# Patient Record
Sex: Male | Born: 1950 | Race: White | Hispanic: No | Marital: Married | Smoking: Former smoker
Health system: Southern US, Community
[De-identification: ages and names within clinical notes are randomized; demographics above are authoritative.]

## PROBLEM LIST (undated history)

## (undated) DIAGNOSIS — J449 Chronic obstructive pulmonary disease, unspecified: Secondary | ICD-10-CM

## (undated) DIAGNOSIS — I2510 Atherosclerotic heart disease of native coronary artery without angina pectoris: Secondary | ICD-10-CM

## (undated) DIAGNOSIS — M542 Cervicalgia: Secondary | ICD-10-CM

## (undated) DIAGNOSIS — Z860100 Personal history of colon polyps, unspecified: Secondary | ICD-10-CM

## (undated) DIAGNOSIS — R600 Localized edema: Secondary | ICD-10-CM

## (undated) DIAGNOSIS — M549 Dorsalgia, unspecified: Secondary | ICD-10-CM

## (undated) DIAGNOSIS — N138 Other obstructive and reflux uropathy: Secondary | ICD-10-CM

## (undated) DIAGNOSIS — I119 Hypertensive heart disease without heart failure: Secondary | ICD-10-CM

## (undated) DIAGNOSIS — M4712 Other spondylosis with myelopathy, cervical region: Secondary | ICD-10-CM

## (undated) DIAGNOSIS — I25119 Atherosclerotic heart disease of native coronary artery with unspecified angina pectoris: Secondary | ICD-10-CM

## (undated) DIAGNOSIS — H26493 Other secondary cataract, bilateral: Secondary | ICD-10-CM

## (undated) DIAGNOSIS — E785 Hyperlipidemia, unspecified: Secondary | ICD-10-CM

## (undated) DIAGNOSIS — E119 Type 2 diabetes mellitus without complications: Secondary | ICD-10-CM

## (undated) DIAGNOSIS — J189 Pneumonia, unspecified organism: Secondary | ICD-10-CM

## (undated) DIAGNOSIS — K219 Gastro-esophageal reflux disease without esophagitis: Secondary | ICD-10-CM

## (undated) DIAGNOSIS — M1990 Unspecified osteoarthritis, unspecified site: Secondary | ICD-10-CM

## (undated) DIAGNOSIS — F17200 Nicotine dependence, unspecified, uncomplicated: Secondary | ICD-10-CM

## (undated) DIAGNOSIS — I739 Peripheral vascular disease, unspecified: Secondary | ICD-10-CM

## (undated) DIAGNOSIS — M545 Low back pain, unspecified: Secondary | ICD-10-CM

## (undated) DIAGNOSIS — E1169 Type 2 diabetes mellitus with other specified complication: Secondary | ICD-10-CM

## (undated) DIAGNOSIS — H01009 Unspecified blepharitis unspecified eye, unspecified eyelid: Secondary | ICD-10-CM

## (undated) DIAGNOSIS — G8929 Other chronic pain: Secondary | ICD-10-CM

## (undated) DIAGNOSIS — I10 Essential (primary) hypertension: Secondary | ICD-10-CM

## (undated) HISTORY — DX: Other obstructive and reflux uropathy: N13.8

## (undated) HISTORY — DX: Other spondylosis with myelopathy, cervical region: M47.12

## (undated) HISTORY — PX: BACK SURGERY: SHX140

## (undated) HISTORY — DX: Cervicalgia: M54.2

## (undated) HISTORY — DX: Hyperlipidemia, unspecified: E78.5

## (undated) HISTORY — DX: Low back pain, unspecified: M54.50

## (undated) HISTORY — DX: Atherosclerotic heart disease of native coronary artery with unspecified angina pectoris: I25.119

## (undated) HISTORY — DX: Other secondary cataract, bilateral: H26.493

## (undated) HISTORY — PX: CERVICAL SPINE SURGERY: SHX589

## (undated) HISTORY — PX: SPINE SURGERY: SHX786

## (undated) HISTORY — DX: Personal history of colon polyps, unspecified: Z86.0100

## (undated) HISTORY — PX: CORONARY ARTERY BYPASS GRAFT: SHX141

## (undated) HISTORY — DX: Personal history of colonic polyps: Z86.010

## (undated) HISTORY — DX: Nicotine dependence, unspecified, uncomplicated: F17.200

## (undated) HISTORY — PX: EYE SURGERY: SHX253

## (undated) HISTORY — DX: Gastro-esophageal reflux disease without esophagitis: K21.9

## (undated) HISTORY — DX: Unspecified osteoarthritis, unspecified site: M19.90

## (undated) HISTORY — DX: Unspecified blepharitis unspecified eye, unspecified eyelid: H01.009

## (undated) HISTORY — DX: Chronic obstructive pulmonary disease, unspecified: J44.9

## (undated) HISTORY — DX: Pneumonia, unspecified organism: J18.9

## (undated) HISTORY — DX: Peripheral vascular disease, unspecified: I73.9

## (undated) HISTORY — DX: Localized edema: R60.0

## (undated) HISTORY — DX: Type 2 diabetes mellitus with other specified complication: E11.69

## (undated) HISTORY — DX: Hypertensive heart disease without heart failure: I11.9

---
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
## 2012-11-06 DIAGNOSIS — E109 Type 1 diabetes mellitus without complications: Secondary | ICD-10-CM | POA: Diagnosis not present

## 2012-11-06 DIAGNOSIS — M545 Low back pain, unspecified: Secondary | ICD-10-CM | POA: Diagnosis not present

## 2012-11-06 DIAGNOSIS — E785 Hyperlipidemia, unspecified: Secondary | ICD-10-CM | POA: Diagnosis not present

## 2012-11-06 DIAGNOSIS — I10 Essential (primary) hypertension: Secondary | ICD-10-CM | POA: Diagnosis not present

## 2012-11-11 DIAGNOSIS — I2510 Atherosclerotic heart disease of native coronary artery without angina pectoris: Secondary | ICD-10-CM | POA: Diagnosis not present

## 2012-11-11 DIAGNOSIS — F17200 Nicotine dependence, unspecified, uncomplicated: Secondary | ICD-10-CM | POA: Diagnosis not present

## 2012-11-11 DIAGNOSIS — I119 Hypertensive heart disease without heart failure: Secondary | ICD-10-CM | POA: Diagnosis not present

## 2012-11-11 DIAGNOSIS — E119 Type 2 diabetes mellitus without complications: Secondary | ICD-10-CM | POA: Diagnosis not present

## 2012-12-03 DIAGNOSIS — I119 Hypertensive heart disease without heart failure: Secondary | ICD-10-CM | POA: Diagnosis not present

## 2013-01-01 DIAGNOSIS — I119 Hypertensive heart disease without heart failure: Secondary | ICD-10-CM | POA: Diagnosis not present

## 2013-01-01 DIAGNOSIS — I509 Heart failure, unspecified: Secondary | ICD-10-CM | POA: Diagnosis not present

## 2013-01-05 DIAGNOSIS — IMO0002 Reserved for concepts with insufficient information to code with codable children: Secondary | ICD-10-CM | POA: Diagnosis not present

## 2013-01-08 DIAGNOSIS — M5137 Other intervertebral disc degeneration, lumbosacral region: Secondary | ICD-10-CM | POA: Diagnosis not present

## 2013-01-08 DIAGNOSIS — M5126 Other intervertebral disc displacement, lumbar region: Secondary | ICD-10-CM | POA: Diagnosis not present

## 2013-02-04 DIAGNOSIS — I119 Hypertensive heart disease without heart failure: Secondary | ICD-10-CM | POA: Diagnosis not present

## 2013-02-05 DIAGNOSIS — K2100 Gastro-esophageal reflux disease with esophagitis, without bleeding: Secondary | ICD-10-CM | POA: Diagnosis not present

## 2013-02-05 DIAGNOSIS — J449 Chronic obstructive pulmonary disease, unspecified: Secondary | ICD-10-CM | POA: Diagnosis not present

## 2013-02-05 DIAGNOSIS — M545 Low back pain, unspecified: Secondary | ICD-10-CM | POA: Diagnosis not present

## 2013-02-05 DIAGNOSIS — I259 Chronic ischemic heart disease, unspecified: Secondary | ICD-10-CM | POA: Diagnosis not present

## 2013-02-05 DIAGNOSIS — E109 Type 1 diabetes mellitus without complications: Secondary | ICD-10-CM | POA: Diagnosis not present

## 2013-05-07 DIAGNOSIS — J449 Chronic obstructive pulmonary disease, unspecified: Secondary | ICD-10-CM | POA: Diagnosis not present

## 2013-05-07 DIAGNOSIS — I10 Essential (primary) hypertension: Secondary | ICD-10-CM | POA: Diagnosis not present

## 2013-05-07 DIAGNOSIS — M545 Low back pain, unspecified: Secondary | ICD-10-CM | POA: Diagnosis not present

## 2013-05-07 DIAGNOSIS — E785 Hyperlipidemia, unspecified: Secondary | ICD-10-CM | POA: Diagnosis not present

## 2013-05-07 DIAGNOSIS — E109 Type 1 diabetes mellitus without complications: Secondary | ICD-10-CM | POA: Diagnosis not present

## 2013-05-12 DIAGNOSIS — I2510 Atherosclerotic heart disease of native coronary artery without angina pectoris: Secondary | ICD-10-CM | POA: Diagnosis not present

## 2013-05-12 DIAGNOSIS — I119 Hypertensive heart disease without heart failure: Secondary | ICD-10-CM | POA: Diagnosis not present

## 2013-08-09 DIAGNOSIS — J449 Chronic obstructive pulmonary disease, unspecified: Secondary | ICD-10-CM | POA: Diagnosis not present

## 2013-08-09 DIAGNOSIS — Z23 Encounter for immunization: Secondary | ICD-10-CM | POA: Diagnosis not present

## 2013-08-09 DIAGNOSIS — M545 Low back pain, unspecified: Secondary | ICD-10-CM | POA: Diagnosis not present

## 2013-08-09 DIAGNOSIS — I259 Chronic ischemic heart disease, unspecified: Secondary | ICD-10-CM | POA: Diagnosis not present

## 2013-08-09 DIAGNOSIS — I119 Hypertensive heart disease without heart failure: Secondary | ICD-10-CM | POA: Diagnosis not present

## 2013-10-20 DIAGNOSIS — I10 Essential (primary) hypertension: Secondary | ICD-10-CM | POA: Diagnosis not present

## 2013-10-20 DIAGNOSIS — I119 Hypertensive heart disease without heart failure: Secondary | ICD-10-CM | POA: Diagnosis not present

## 2013-10-20 DIAGNOSIS — IMO0001 Reserved for inherently not codable concepts without codable children: Secondary | ICD-10-CM | POA: Diagnosis not present

## 2013-10-20 DIAGNOSIS — M545 Low back pain, unspecified: Secondary | ICD-10-CM | POA: Diagnosis not present

## 2013-10-20 DIAGNOSIS — I259 Chronic ischemic heart disease, unspecified: Secondary | ICD-10-CM | POA: Diagnosis not present

## 2013-10-20 DIAGNOSIS — J4489 Other specified chronic obstructive pulmonary disease: Secondary | ICD-10-CM | POA: Diagnosis not present

## 2013-10-20 DIAGNOSIS — J449 Chronic obstructive pulmonary disease, unspecified: Secondary | ICD-10-CM | POA: Diagnosis not present

## 2013-11-09 DIAGNOSIS — M545 Low back pain, unspecified: Secondary | ICD-10-CM | POA: Diagnosis not present

## 2013-11-09 DIAGNOSIS — E109 Type 1 diabetes mellitus without complications: Secondary | ICD-10-CM | POA: Diagnosis not present

## 2013-11-09 DIAGNOSIS — I259 Chronic ischemic heart disease, unspecified: Secondary | ICD-10-CM | POA: Diagnosis not present

## 2013-11-09 DIAGNOSIS — I119 Hypertensive heart disease without heart failure: Secondary | ICD-10-CM | POA: Diagnosis not present

## 2014-01-19 ENCOUNTER — Encounter: Payer: Self-pay | Admitting: Family Medicine

## 2014-01-19 DIAGNOSIS — Z79899 Other long term (current) drug therapy: Secondary | ICD-10-CM | POA: Diagnosis not present

## 2014-01-19 DIAGNOSIS — J449 Chronic obstructive pulmonary disease, unspecified: Secondary | ICD-10-CM | POA: Diagnosis not present

## 2014-01-19 DIAGNOSIS — E109 Type 1 diabetes mellitus without complications: Secondary | ICD-10-CM | POA: Diagnosis not present

## 2014-01-19 DIAGNOSIS — Z5181 Encounter for therapeutic drug level monitoring: Secondary | ICD-10-CM | POA: Diagnosis not present

## 2014-01-19 DIAGNOSIS — J4489 Other specified chronic obstructive pulmonary disease: Secondary | ICD-10-CM | POA: Diagnosis not present

## 2014-01-19 DIAGNOSIS — M545 Low back pain, unspecified: Secondary | ICD-10-CM | POA: Diagnosis not present

## 2014-01-19 DIAGNOSIS — I259 Chronic ischemic heart disease, unspecified: Secondary | ICD-10-CM | POA: Diagnosis not present

## 2014-01-19 DIAGNOSIS — K2100 Gastro-esophageal reflux disease with esophagitis, without bleeding: Secondary | ICD-10-CM | POA: Diagnosis not present

## 2014-01-19 DIAGNOSIS — I119 Hypertensive heart disease without heart failure: Secondary | ICD-10-CM | POA: Diagnosis not present

## 2014-02-08 DIAGNOSIS — M545 Low back pain, unspecified: Secondary | ICD-10-CM | POA: Diagnosis not present

## 2014-02-08 DIAGNOSIS — E785 Hyperlipidemia, unspecified: Secondary | ICD-10-CM | POA: Diagnosis not present

## 2014-02-08 DIAGNOSIS — I259 Chronic ischemic heart disease, unspecified: Secondary | ICD-10-CM | POA: Diagnosis not present

## 2014-02-08 DIAGNOSIS — J449 Chronic obstructive pulmonary disease, unspecified: Secondary | ICD-10-CM | POA: Diagnosis not present

## 2014-02-24 DIAGNOSIS — R7989 Other specified abnormal findings of blood chemistry: Secondary | ICD-10-CM | POA: Diagnosis not present

## 2014-02-24 DIAGNOSIS — E785 Hyperlipidemia, unspecified: Secondary | ICD-10-CM | POA: Diagnosis not present

## 2014-02-24 DIAGNOSIS — I2510 Atherosclerotic heart disease of native coronary artery without angina pectoris: Secondary | ICD-10-CM | POA: Diagnosis not present

## 2014-02-24 DIAGNOSIS — I119 Hypertensive heart disease without heart failure: Secondary | ICD-10-CM | POA: Diagnosis not present

## 2014-03-25 DIAGNOSIS — I119 Hypertensive heart disease without heart failure: Secondary | ICD-10-CM | POA: Diagnosis not present

## 2014-03-30 DIAGNOSIS — J309 Allergic rhinitis, unspecified: Secondary | ICD-10-CM | POA: Diagnosis not present

## 2014-03-30 DIAGNOSIS — H6500 Acute serous otitis media, unspecified ear: Secondary | ICD-10-CM | POA: Diagnosis not present

## 2014-03-30 DIAGNOSIS — J3489 Other specified disorders of nose and nasal sinuses: Secondary | ICD-10-CM | POA: Diagnosis not present

## 2014-03-30 DIAGNOSIS — R0982 Postnasal drip: Secondary | ICD-10-CM | POA: Diagnosis not present

## 2014-04-14 DIAGNOSIS — E669 Obesity, unspecified: Secondary | ICD-10-CM | POA: Diagnosis not present

## 2014-04-14 DIAGNOSIS — T7840XA Allergy, unspecified, initial encounter: Secondary | ICD-10-CM | POA: Diagnosis not present

## 2014-04-14 DIAGNOSIS — I119 Hypertensive heart disease without heart failure: Secondary | ICD-10-CM | POA: Diagnosis not present

## 2014-04-21 DIAGNOSIS — I10 Essential (primary) hypertension: Secondary | ICD-10-CM | POA: Diagnosis not present

## 2014-04-21 DIAGNOSIS — M545 Low back pain, unspecified: Secondary | ICD-10-CM | POA: Diagnosis not present

## 2014-04-21 DIAGNOSIS — E109 Type 1 diabetes mellitus without complications: Secondary | ICD-10-CM | POA: Diagnosis not present

## 2014-04-21 DIAGNOSIS — E785 Hyperlipidemia, unspecified: Secondary | ICD-10-CM | POA: Diagnosis not present

## 2014-04-21 DIAGNOSIS — J449 Chronic obstructive pulmonary disease, unspecified: Secondary | ICD-10-CM | POA: Diagnosis not present

## 2014-04-21 DIAGNOSIS — I259 Chronic ischemic heart disease, unspecified: Secondary | ICD-10-CM | POA: Diagnosis not present

## 2014-05-09 DIAGNOSIS — J310 Chronic rhinitis: Secondary | ICD-10-CM | POA: Diagnosis not present

## 2014-05-09 DIAGNOSIS — H905 Unspecified sensorineural hearing loss: Secondary | ICD-10-CM | POA: Diagnosis not present

## 2014-05-09 DIAGNOSIS — J3489 Other specified disorders of nose and nasal sinuses: Secondary | ICD-10-CM | POA: Diagnosis not present

## 2014-05-09 DIAGNOSIS — H903 Sensorineural hearing loss, bilateral: Secondary | ICD-10-CM | POA: Diagnosis not present

## 2014-05-11 DIAGNOSIS — M545 Low back pain, unspecified: Secondary | ICD-10-CM | POA: Diagnosis not present

## 2014-05-11 DIAGNOSIS — K2100 Gastro-esophageal reflux disease with esophagitis, without bleeding: Secondary | ICD-10-CM | POA: Diagnosis not present

## 2014-05-11 DIAGNOSIS — E109 Type 1 diabetes mellitus without complications: Secondary | ICD-10-CM | POA: Diagnosis not present

## 2014-05-11 DIAGNOSIS — J449 Chronic obstructive pulmonary disease, unspecified: Secondary | ICD-10-CM | POA: Diagnosis not present

## 2014-05-12 DIAGNOSIS — I119 Hypertensive heart disease without heart failure: Secondary | ICD-10-CM | POA: Diagnosis not present

## 2014-07-19 ENCOUNTER — Encounter: Payer: Self-pay | Admitting: Family Medicine

## 2014-07-19 DIAGNOSIS — M5116 Intervertebral disc disorders with radiculopathy, lumbar region: Secondary | ICD-10-CM | POA: Diagnosis not present

## 2014-07-19 DIAGNOSIS — M2578 Osteophyte, vertebrae: Secondary | ICD-10-CM | POA: Diagnosis not present

## 2014-07-19 DIAGNOSIS — M4726 Other spondylosis with radiculopathy, lumbar region: Secondary | ICD-10-CM | POA: Diagnosis not present

## 2014-07-19 DIAGNOSIS — M4727 Other spondylosis with radiculopathy, lumbosacral region: Secondary | ICD-10-CM | POA: Diagnosis not present

## 2014-07-19 DIAGNOSIS — M545 Low back pain: Secondary | ICD-10-CM | POA: Diagnosis not present

## 2014-07-19 DIAGNOSIS — M5117 Intervertebral disc disorders with radiculopathy, lumbosacral region: Secondary | ICD-10-CM | POA: Diagnosis not present

## 2014-07-26 DIAGNOSIS — M4806 Spinal stenosis, lumbar region: Secondary | ICD-10-CM | POA: Diagnosis not present

## 2014-07-29 DIAGNOSIS — M5136 Other intervertebral disc degeneration, lumbar region: Secondary | ICD-10-CM | POA: Diagnosis not present

## 2014-07-29 DIAGNOSIS — M5126 Other intervertebral disc displacement, lumbar region: Secondary | ICD-10-CM | POA: Diagnosis not present

## 2014-07-29 DIAGNOSIS — M545 Low back pain: Secondary | ICD-10-CM | POA: Diagnosis not present

## 2014-07-29 DIAGNOSIS — I739 Peripheral vascular disease, unspecified: Secondary | ICD-10-CM | POA: Diagnosis not present

## 2014-07-29 DIAGNOSIS — M4806 Spinal stenosis, lumbar region: Secondary | ICD-10-CM | POA: Diagnosis not present

## 2014-08-15 DIAGNOSIS — E119 Type 2 diabetes mellitus without complications: Secondary | ICD-10-CM | POA: Diagnosis not present

## 2014-08-15 DIAGNOSIS — J449 Chronic obstructive pulmonary disease, unspecified: Secondary | ICD-10-CM | POA: Diagnosis not present

## 2014-08-15 DIAGNOSIS — M545 Low back pain: Secondary | ICD-10-CM | POA: Diagnosis not present

## 2014-08-15 DIAGNOSIS — I2510 Atherosclerotic heart disease of native coronary artery without angina pectoris: Secondary | ICD-10-CM | POA: Diagnosis not present

## 2014-08-26 DIAGNOSIS — E785 Hyperlipidemia, unspecified: Secondary | ICD-10-CM | POA: Diagnosis not present

## 2014-08-26 DIAGNOSIS — I10 Essential (primary) hypertension: Secondary | ICD-10-CM | POA: Diagnosis not present

## 2014-08-26 DIAGNOSIS — E669 Obesity, unspecified: Secondary | ICD-10-CM | POA: Diagnosis not present

## 2014-08-26 DIAGNOSIS — I2510 Atherosclerotic heart disease of native coronary artery without angina pectoris: Secondary | ICD-10-CM | POA: Diagnosis not present

## 2014-08-29 DIAGNOSIS — M79605 Pain in left leg: Secondary | ICD-10-CM | POA: Diagnosis not present

## 2014-08-29 DIAGNOSIS — R0989 Other specified symptoms and signs involving the circulatory and respiratory systems: Secondary | ICD-10-CM | POA: Diagnosis not present

## 2014-08-29 DIAGNOSIS — M5136 Other intervertebral disc degeneration, lumbar region: Secondary | ICD-10-CM | POA: Diagnosis not present

## 2014-08-29 DIAGNOSIS — M4316 Spondylolisthesis, lumbar region: Secondary | ICD-10-CM | POA: Diagnosis not present

## 2014-08-29 DIAGNOSIS — Z01818 Encounter for other preprocedural examination: Secondary | ICD-10-CM | POA: Diagnosis not present

## 2014-08-29 DIAGNOSIS — Z79899 Other long term (current) drug therapy: Secondary | ICD-10-CM | POA: Diagnosis not present

## 2014-08-29 DIAGNOSIS — M4806 Spinal stenosis, lumbar region: Secondary | ICD-10-CM | POA: Diagnosis not present

## 2014-08-29 DIAGNOSIS — M79604 Pain in right leg: Secondary | ICD-10-CM | POA: Diagnosis not present

## 2014-08-31 DIAGNOSIS — I2510 Atherosclerotic heart disease of native coronary artery without angina pectoris: Secondary | ICD-10-CM | POA: Diagnosis not present

## 2014-08-31 DIAGNOSIS — R0602 Shortness of breath: Secondary | ICD-10-CM | POA: Diagnosis not present

## 2014-08-31 DIAGNOSIS — R079 Chest pain, unspecified: Secondary | ICD-10-CM | POA: Diagnosis not present

## 2014-08-31 DIAGNOSIS — R0789 Other chest pain: Secondary | ICD-10-CM | POA: Diagnosis not present

## 2014-09-01 DIAGNOSIS — I6523 Occlusion and stenosis of bilateral carotid arteries: Secondary | ICD-10-CM | POA: Diagnosis not present

## 2014-09-01 DIAGNOSIS — R0989 Other specified symptoms and signs involving the circulatory and respiratory systems: Secondary | ICD-10-CM | POA: Diagnosis not present

## 2014-09-02 DIAGNOSIS — K219 Gastro-esophageal reflux disease without esophagitis: Secondary | ICD-10-CM | POA: Diagnosis present

## 2014-09-02 DIAGNOSIS — Z0189 Encounter for other specified special examinations: Secondary | ICD-10-CM | POA: Diagnosis not present

## 2014-09-02 DIAGNOSIS — I10 Essential (primary) hypertension: Secondary | ICD-10-CM | POA: Diagnosis present

## 2014-09-02 DIAGNOSIS — Z951 Presence of aortocoronary bypass graft: Secondary | ICD-10-CM | POA: Diagnosis not present

## 2014-09-02 DIAGNOSIS — M4806 Spinal stenosis, lumbar region: Secondary | ICD-10-CM | POA: Diagnosis not present

## 2014-09-02 DIAGNOSIS — I25810 Atherosclerosis of coronary artery bypass graft(s) without angina pectoris: Secondary | ICD-10-CM | POA: Diagnosis present

## 2014-09-02 DIAGNOSIS — Z23 Encounter for immunization: Secondary | ICD-10-CM | POA: Diagnosis not present

## 2014-10-20 DIAGNOSIS — I2510 Atherosclerotic heart disease of native coronary artery without angina pectoris: Secondary | ICD-10-CM | POA: Diagnosis not present

## 2014-10-20 DIAGNOSIS — E119 Type 2 diabetes mellitus without complications: Secondary | ICD-10-CM | POA: Diagnosis not present

## 2014-10-20 DIAGNOSIS — I119 Hypertensive heart disease without heart failure: Secondary | ICD-10-CM | POA: Diagnosis not present

## 2014-10-20 DIAGNOSIS — E785 Hyperlipidemia, unspecified: Secondary | ICD-10-CM | POA: Diagnosis not present

## 2014-10-20 DIAGNOSIS — J449 Chronic obstructive pulmonary disease, unspecified: Secondary | ICD-10-CM | POA: Diagnosis not present

## 2014-11-07 DIAGNOSIS — I10 Essential (primary) hypertension: Secondary | ICD-10-CM | POA: Diagnosis not present

## 2014-11-07 DIAGNOSIS — M545 Low back pain: Secondary | ICD-10-CM | POA: Diagnosis not present

## 2014-11-07 DIAGNOSIS — J449 Chronic obstructive pulmonary disease, unspecified: Secondary | ICD-10-CM | POA: Diagnosis not present

## 2014-11-07 DIAGNOSIS — M4712 Other spondylosis with myelopathy, cervical region: Secondary | ICD-10-CM | POA: Diagnosis not present

## 2015-02-08 DIAGNOSIS — J449 Chronic obstructive pulmonary disease, unspecified: Secondary | ICD-10-CM | POA: Diagnosis not present

## 2015-02-08 DIAGNOSIS — N138 Other obstructive and reflux uropathy: Secondary | ICD-10-CM | POA: Diagnosis not present

## 2015-02-08 DIAGNOSIS — E119 Type 2 diabetes mellitus without complications: Secondary | ICD-10-CM | POA: Diagnosis not present

## 2015-02-08 DIAGNOSIS — M545 Low back pain: Secondary | ICD-10-CM | POA: Diagnosis not present

## 2015-02-20 ENCOUNTER — Telehealth: Payer: Self-pay

## 2015-02-20 NOTE — Telephone Encounter (Signed)
Gastroenterology Pre-Procedure Review  Request Date: 02/20/2015 Requesting Physician: Dr. Luan Pulling  PATIENT REVIEW QUESTIONS: The patient responded to the following health history questions as indicated:    1. Diabetes Melitis: YES 2. Joint replacements in the past 12 months: no 3. Major health problems in the past 3 months: no 4. Has an artificial valve or MVP: no 5. Has a defibrillator: no 6. Has been advised in past to take antibiotics in advance of a procedure like teeth cleaning: no    MEDICATIONS & ALLERGIES:    Patient reports the following regarding taking any blood thinners:   Plavix? no Aspirin? YES Coumadin? no  Patient confirms/reports the following medications:  Current Outpatient Prescriptions  Medication Sig Dispense Refill  . amLODipine (NORVASC) 10 MG tablet Take 10 mg by mouth daily.    Marland Kitchen aspirin 81 MG tablet Take 81 mg by mouth daily.    . cyclobenzaprine (FLEXERIL) 10 MG tablet Take 10 mg by mouth 2 (two) times daily.    . enalapril (VASOTEC) 20 MG tablet Take 20 mg by mouth daily.    . hydrochlorothiazide (HYDRODIURIL) 12.5 MG tablet Take 12.5 mg by mouth daily.    Marland Kitchen HYDROcodone-acetaminophen (NORCO) 10-325 MG per tablet Take 1 tablet by mouth every 6 (six) hours as needed. Pt takes one qid    . metFORMIN (GLUCOPHAGE) 500 MG tablet Take by mouth 2 (two) times daily with a meal.    . metoprolol tartrate (LOPRESSOR) 25 MG tablet Take 25 mg by mouth 2 (two) times daily.    . pravastatin (PRAVACHOL) 80 MG tablet Take 80 mg by mouth daily.    . tamsulosin (FLOMAX) 0.4 MG CAPS capsule Take 0.4 mg by mouth.     No current facility-administered medications for this visit.    Patient confirms/reports the following allergies:  No Known Allergies  No orders of the defined types were placed in this encounter.    AUTHORIZATION INFORMATION Primary Insurance:  ID #:  Group #:  Pre-Cert / Auth required:  Pre-Cert / Auth #:   Secondary Insurance:  ID #:  Group #:   Pre-Cert / Auth required:  Pre-Cert / Auth #:   SCHEDULE INFORMATION: Procedure has been scheduled as follows:  Date:  03/03/2015               Time:  12:30 PM Location: [facility] Short Stay  This Gastroenterology Pre-Precedure Review Form is being routed to the following provider(s): Barney Drain, MD

## 2015-02-21 ENCOUNTER — Other Ambulatory Visit: Payer: Self-pay

## 2015-02-21 DIAGNOSIS — Z1211 Encounter for screening for malignant neoplasm of colon: Secondary | ICD-10-CM

## 2015-02-21 MED ORDER — SOD PICOSULFATE-MAG OX-CIT ACD 10-3.5-12 MG-GM-GM PO PACK: 1.0000 | PACK | ORAL | Status: DC

## 2015-02-21 NOTE — Telephone Encounter (Signed)
Rx sent to the pharmacy and instructions mailed to pt.  

## 2015-02-21 NOTE — Telephone Encounter (Signed)
PREPOPIK-DRINK WATER TO KEEP URINE LIGHT YELLOW.  HOLD HCTZ on the DAY BEFORE AND DAY OF PROCEDURE.

## 2015-02-22 NOTE — Telephone Encounter (Signed)
Prepopik not covered. Rx for Trilyte sent to the pharmacy with faxed instructions and Rosendo Gros had told the wife to ask for the instructions at the pharmacy.

## 2015-02-23 ENCOUNTER — Telehealth: Payer: Self-pay

## 2015-02-23 MED ORDER — PEG 3350-KCL-NA BICARB-NACL 420 G PO SOLR: 4000.0000 mL | ORAL | Status: DC

## 2015-02-23 NOTE — Telephone Encounter (Signed)
I called the pharmacy and confirmed they received the Rx and the faxed instructions.

## 2015-02-23 NOTE — Telephone Encounter (Signed)
Pt called and pharmacy had not received new Rx for the Trilyte prep. Send it in and refaxed the instructions.

## 2015-03-03 ENCOUNTER — Ambulatory Visit (HOSPITAL_COMMUNITY)
Admission: RE | Admit: 2015-03-03 | Discharge: 2015-03-03 | Disposition: A | Discharge status: Home / Self Care | Payer: Medicare Other | Source: Ambulatory Visit | Attending: Gastroenterology | Admitting: Gastroenterology

## 2015-03-03 ENCOUNTER — Encounter (HOSPITAL_COMMUNITY): Payer: Self-pay | Admitting: *Deleted

## 2015-03-03 ENCOUNTER — Encounter (HOSPITAL_COMMUNITY)
Admission: RE | Disposition: A | Discharge status: Home / Self Care | Payer: Self-pay | Source: Ambulatory Visit | Attending: Gastroenterology

## 2015-03-03 DIAGNOSIS — K621 Rectal polyp: Secondary | ICD-10-CM | POA: Diagnosis not present

## 2015-03-03 DIAGNOSIS — K648 Other hemorrhoids: Secondary | ICD-10-CM | POA: Diagnosis not present

## 2015-03-03 DIAGNOSIS — Z79899 Other long term (current) drug therapy: Secondary | ICD-10-CM | POA: Diagnosis not present

## 2015-03-03 DIAGNOSIS — G8929 Other chronic pain: Secondary | ICD-10-CM | POA: Insufficient documentation

## 2015-03-03 DIAGNOSIS — D123 Benign neoplasm of transverse colon: Secondary | ICD-10-CM | POA: Diagnosis not present

## 2015-03-03 DIAGNOSIS — Z1211 Encounter for screening for malignant neoplasm of colon: Secondary | ICD-10-CM | POA: Diagnosis not present

## 2015-03-03 DIAGNOSIS — Z951 Presence of aortocoronary bypass graft: Secondary | ICD-10-CM | POA: Insufficient documentation

## 2015-03-03 DIAGNOSIS — K5730 Diverticulosis of large intestine without perforation or abscess without bleeding: Secondary | ICD-10-CM | POA: Diagnosis not present

## 2015-03-03 DIAGNOSIS — K635 Polyp of colon: Secondary | ICD-10-CM

## 2015-03-03 DIAGNOSIS — E119 Type 2 diabetes mellitus without complications: Secondary | ICD-10-CM | POA: Diagnosis not present

## 2015-03-03 DIAGNOSIS — I2510 Atherosclerotic heart disease of native coronary artery without angina pectoris: Secondary | ICD-10-CM | POA: Diagnosis not present

## 2015-03-03 DIAGNOSIS — M549 Dorsalgia, unspecified: Secondary | ICD-10-CM | POA: Insufficient documentation

## 2015-03-03 DIAGNOSIS — K219 Gastro-esophageal reflux disease without esophagitis: Secondary | ICD-10-CM | POA: Insufficient documentation

## 2015-03-03 DIAGNOSIS — I10 Essential (primary) hypertension: Secondary | ICD-10-CM | POA: Diagnosis not present

## 2015-03-03 DIAGNOSIS — Z79891 Long term (current) use of opiate analgesic: Secondary | ICD-10-CM | POA: Insufficient documentation

## 2015-03-03 DIAGNOSIS — D125 Benign neoplasm of sigmoid colon: Secondary | ICD-10-CM | POA: Insufficient documentation

## 2015-03-03 DIAGNOSIS — Z87891 Personal history of nicotine dependence: Secondary | ICD-10-CM | POA: Diagnosis not present

## 2015-03-03 DIAGNOSIS — M6289 Other specified disorders of muscle: Secondary | ICD-10-CM | POA: Diagnosis not present

## 2015-03-03 DIAGNOSIS — D124 Benign neoplasm of descending colon: Secondary | ICD-10-CM | POA: Diagnosis not present

## 2015-03-03 DIAGNOSIS — Z125 Encounter for screening for malignant neoplasm of prostate: Secondary | ICD-10-CM | POA: Insufficient documentation

## 2015-03-03 DIAGNOSIS — Z7982 Long term (current) use of aspirin: Secondary | ICD-10-CM | POA: Diagnosis not present

## 2015-03-03 DIAGNOSIS — K6389 Other specified diseases of intestine: Secondary | ICD-10-CM | POA: Insufficient documentation

## 2015-03-03 HISTORY — DX: Type 2 diabetes mellitus without complications: E11.9

## 2015-03-03 HISTORY — DX: Gastro-esophageal reflux disease without esophagitis: K21.9

## 2015-03-03 HISTORY — PX: COLONOSCOPY: SHX5424

## 2015-03-03 HISTORY — DX: Other chronic pain: G89.29

## 2015-03-03 HISTORY — DX: Atherosclerotic heart disease of native coronary artery without angina pectoris: I25.10

## 2015-03-03 HISTORY — DX: Essential (primary) hypertension: I10

## 2015-03-03 HISTORY — DX: Dorsalgia, unspecified: M54.9

## 2015-03-03 LAB — GLUCOSE, CAPILLARY: GLUCOSE-CAPILLARY: 210 mg/dL — AB (ref 65–99)

## 2015-03-03 LAB — HM COLONOSCOPY

## 2015-03-03 SURGERY — COLONOSCOPY
Anesthesia: Moderate Sedation

## 2015-03-03 MED ORDER — STERILE WATER FOR IRRIGATION IR SOLN
Status: DC | PRN
  Administered 2015-03-03: 12:00:00

## 2015-03-03 MED ORDER — SODIUM CHLORIDE 0.9 % IV SOLN
INTRAVENOUS | Status: DC
  Administered 2015-03-03: 12:00:00 via INTRAVENOUS

## 2015-03-03 MED ORDER — MEPERIDINE HCL 100 MG/ML IJ SOLN
INTRAMUSCULAR | Status: DC | PRN
  Administered 2015-03-03: 25 mg via INTRAVENOUS
  Administered 2015-03-03: 50 mg via INTRAVENOUS

## 2015-03-03 MED ORDER — MEPERIDINE HCL 100 MG/ML IJ SOLN
INTRAMUSCULAR | Status: AC
  Filled 2015-03-03: qty 2

## 2015-03-03 MED ORDER — MIDAZOLAM HCL 5 MG/5ML IJ SOLN
INTRAMUSCULAR | Status: DC | PRN
  Administered 2015-03-03 (×2): 2 mg via INTRAVENOUS
  Administered 2015-03-03: 1 mg via INTRAVENOUS

## 2015-03-03 MED ORDER — MIDAZOLAM HCL 5 MG/5ML IJ SOLN
INTRAMUSCULAR | Status: AC
  Filled 2015-03-03: qty 10

## 2015-03-03 NOTE — Op Note (Signed)
[facility] [address] [place], [address]   COLONOSCOPY PROCEDURE REPORT  PATIENT: Ricardo Walter, Ricardo Walter  MR#: [id number] BIRTHDATE: May 25, 1951 , 49  yrs. old GENDER: male ENDOSCOPIST: Danie Binder, MD REFERRED XB:JYNWGN Hawkins, M.D. PROCEDURE DATE:  17-Mar-2015 PROCEDURE:   Colonoscopy with cold biopsy polypectomy and Colonoscopy with snare polypectomy INDICATIONS:average risk patient for colon cancer. MEDICATIONS: Demerol 75 mg IV and Versed 5 mg IV  DESCRIPTION OF PROCEDURE:    Physical exam was performed.  Informed consent was obtained from the patient after explaining the benefits, risks, and alternatives to procedure.  The patient was connected to monitor and placed in left lateral position. Continuous oxygen was provided by nasal cannula and IV medicine administered through an indwelling cannula.  After administration of sedation and rectal exam, the patients rectum was intubated and the EC-[id number] ([id number])  colonoscope was advanced under direct visualization to the cecum.  The scope was removed slowly by carefully examining the color, texture, anatomy, and integrity mucosa on the way out.  The patient was recovered in endoscopy and discharged home in satisfactory condition. Estimated blood loss is zero unless otherwise noted in this procedure report.    COLON FINDINGS: Six sessile polyps ranging from 2 to 54mm in size were found in the descending colon, rectum, at the hepatic flexure, and in the transverse colon.  A polypectomy was performed with cold forceps.  , Three sessile polyps ranging from 5 to 42mm in size were found in the rectum and sigmoid colon.  A polypectomy was performed using snare cautery.  , There was moderate diverticulosis noted in the sigmoid colon and descending colon with associated muscular hypertrophy and tortuosity.  , The colon was redundant.  Manual abdominal counter-pressure was used to reach the cecum, and Small internal  hemorrhoids were found.  PREP QUALITY: excellent.CECAL W/D TIME: 19      minutes COMPLICATIONS: None  ENDOSCOPIC IMPRESSION: 1.   NINE POLYPS REMOVED 2.   Moderate diverticulosis in the sigmoid colon and descending colon 3.   The LEFT colon IS SLIGHTLY redundant 4.   Small internal hemorrhoids  RECOMMENDATIONS: DRINK WATER TO KEEP YOUR URINE LIGHT YELLOW. FOLLOW A HIGH FIBER DIET. AWAIT BIOPSY RESULTS. Next colonoscopy in 1-3 years.  YOUR SISTERS, BROTHERS, CHILDREN, AND PARENTS NEED TO HAVE A COLONOSCOPY STARTING AT THE AGE OF 40.   eSigned:  Danie Binder, MD 03-17-15 1:19 PM    CPT CODES: ICD CODES:  The ICD and CPT codes recommended by this software are interpretations from the data that the clinical staff has captured with the software.  The verification of the translation of this report to the ICD and CPT codes and modifiers is the sole responsibility of the health care institution and practicing physician where this report was generated.  [employer] will not be held responsible for the validity of the ICD and CPT codes included on this report.  AMA assumes no liability for data contained or not contained herein. CPT is a Designer, television/film set of the [employer].

## 2015-03-03 NOTE — Discharge Instructions (Signed)
You had 9 polyps removed. You have small internal hemorrhoids and diverticulosis IN YOUR RIGHT AND LEFT COLON.   DRINK WATER TO KEEP YOUR URINE LIGHT YELLOW.  FOLLOW A HIGH FIBER DIET. AVOID ITEMS THAT CAUSE BLOATING. SEE INFO BELOW.  YOUR BIOPSY RESULTS WILL BE AVAILABLE IN MY CHART AFTER JUN 21 AND MY OFFICE WILL CONTACT YOU IN 10-14 DAYS WITH YOUR RESULTS.   Next colonoscopy in 1-3 years. YOUR SISTERS, BROTHERS, CHILDREN, AND PARENTS NEED TO HAVE A COLONOSCOPY STARTING AT THE AGE OF 40.    Colonoscopy Care After Read the instructions outlined below and refer to this sheet in the next week. These discharge instructions provide you with general information on caring for yourself after you leave the hospital. While your treatment has been planned according to the most current medical practices available, unavoidable complications occasionally occur. If you have any problems or questions after discharge, call DR. Zaliah Wissner, [phone].  ACTIVITY  You may resume your regular activity, but move at a slower pace for the next 24 hours.   Take frequent rest periods for the next 24 hours.   Walking will help get rid of the air and reduce the bloated feeling in your belly (abdomen).   No driving for 24 hours (because of the medicine (anesthesia) used during the test).   You may shower.   Do not sign any important legal documents or operate any machinery for 24 hours (because of the anesthesia used during the test).    NUTRITION  Drink plenty of fluids.   You may resume your normal diet as instructed by your doctor.   Begin with a light meal and progress to your normal diet. Heavy or fried foods are harder to digest and may make you feel sick to your stomach (nauseated).   Avoid alcoholic beverages for 24 hours or as instructed.    MEDICATIONS  You may resume your normal medications.   WHAT YOU CAN EXPECT TODAY  Some feelings of bloating in the abdomen.   Passage of more gas  than usual.   Spotting of blood in your stool or on the toilet paper  .  IF YOU HAD POLYPS REMOVED DURING THE COLONOSCOPY:  Eat a soft diet IF YOU HAVE NAUSEA, BLOATING, ABDOMINAL PAIN, OR VOMITING.    FINDING OUT THE RESULTS OF YOUR TEST Not all test results are available during your visit. DR. Oneida Alar WILL CALL YOU WITHIN 14 DAYS OF YOUR PROCEDUE WITH YOUR RESULTS. Do not assume everything is normal if you have not heard from DR. Areil Ottey, CALL HER OFFICE AT [phone].  SEEK IMMEDIATE MEDICAL ATTENTION AND CALL THE OFFICE: [phone] IF:  You have more than a spotting of blood in your stool.   Your belly is swollen (abdominal distention).   You are nauseated or vomiting.   You have a temperature over 101F.   You have abdominal pain or discomfort that is severe or gets worse throughout the day.  Polyps, Colon  A polyp is extra tissue that grows inside your body. Colon polyps grow in the large intestine. The large intestine, also called the colon, is part of your digestive system. It is a long, hollow tube at the end of your digestive tract where your body makes and stores stool. Most polyps are not dangerous. They are benign. This means they are not cancerous. But over time, some types of polyps can turn into cancer. Polyps that are smaller than a pea are usually not harmful. But larger polyps could  someday become or may already be cancerous. To be safe, doctors remove all polyps and test them.   PREVENTION There is not one sure way to prevent polyps. You might be able to lower your risk of getting them if you:  Eat more fruits and vegetables and less fatty food.   Do not smoke.   Avoid alcohol.   Exercise every day.   Lose weight if you are overweight.   Eating more calcium and folate can also lower your risk of getting polyps. Some foods that are rich in calcium are milk, cheese, and broccoli. Some foods that are rich in folate are chickpeas, kidney beans, and spinach.     High-Fiber Diet A high-fiber diet changes your normal diet to include more whole grains, legumes, fruits, and vegetables. Changes in the diet involve replacing refined carbohydrates with unrefined foods. The calorie level of the diet is essentially unchanged. The Dietary Reference Intake (recommended amount) for adult males is 38 grams per day. For adult females, it is 25 grams per day. Pregnant and lactating women should consume 28 grams of fiber per day. Fiber is the intact part of a plant that is not broken down during digestion. Functional fiber is fiber that has been isolated from the plant to provide a beneficial effect in the body. PURPOSE  Increase stool bulk.   Ease and regulate bowel movements.   Lower cholesterol.   REDUCE YOUR RISK OF COLON CANCER  INDICATIONS THAT YOU NEED MORE FIBER  Constipation and hemorrhoids.   Uncomplicated diverticulosis (intestine condition) and irritable bowel syndrome.   Weight management.   As a protective measure against hardening of the arteries (atherosclerosis), diabetes, and cancer.   GUIDELINES FOR INCREASING FIBER IN THE DIET  Start adding fiber to the diet slowly. A gradual increase of about 5 more grams (2 slices of whole-wheat bread, 2 servings of most fruits or vegetables, or 1 bowl of high-fiber cereal) per day is best. Too rapid an increase in fiber may result in constipation, flatulence, and bloating.   Drink enough water and fluids to keep your urine clear or pale yellow. Water, juice, or caffeine-free drinks are recommended. Not drinking enough fluid may cause constipation.   Eat a variety of high-fiber foods rather than one type of fiber.   Try to increase your intake of fiber through using high-fiber foods rather than fiber pills or supplements that contain small amounts of fiber.   The goal is to change the types of food eaten. Do not supplement your present diet with high-fiber foods, but replace foods in your present  diet.   INCLUDE A VARIETY OF FIBER SOURCES  Replace refined and processed grains with whole grains, canned fruits with fresh fruits, and incorporate other fiber sources. White rice, white breads, and most bakery goods contain little or no fiber.   Brown whole-grain rice, buckwheat oats, and many fruits and vegetables are all good sources of fiber. These include: broccoli, Brussels sprouts, cabbage, cauliflower, beets, sweet potatoes, white potatoes (skin on), carrots, tomatoes, eggplant, squash, berries, fresh fruits, and dried fruits.   Cereals appear to be the richest source of fiber. Cereal fiber is found in whole grains and bran. Bran is the fiber-rich outer coat of cereal grain, which is largely removed in refining. In whole-grain cereals, the bran remains. In breakfast cereals, the largest amount of fiber is found in those with "bran" in their names. The fiber content is sometimes indicated on the label.   You may need  to include additional fruits and vegetables each day.   In baking, for 1 cup white flour, you may use the following substitutions:   1 cup whole-wheat flour minus 2 tablespoons.   1/2 cup white flour plus 1/2 cup whole-wheat flour.   Diverticulosis Diverticulosis is a common condition that develops when small pouches (diverticula) form in the wall of the colon. The risk of diverticulosis increases with age. It happens more often in people who eat a low-fiber diet. Most individuals with diverticulosis have no symptoms. Those individuals with symptoms usually experience belly (abdominal) pain, constipation, or loose stools (diarrhea).  HOME CARE INSTRUCTIONS  Increase the amount of fiber in your diet as directed by your caregiver or dietician. This may reduce symptoms of diverticulosis.   Drink at least 6 to 8 glasses of water each day to prevent constipation.   Try not to strain when you have a bowel movement.   Avoiding nuts and seeds to prevent complications is NOT  NECESSARY.   FOODS HAVING HIGH FIBER CONTENT INCLUDE:  Fruits. Apple, peach, pear, tangerine, raisins, prunes.   Vegetables. Brussels sprouts, asparagus, broccoli, cabbage, carrot, cauliflower, romaine lettuce, spinach, summer squash, tomato, winter squash, zucchini.   Starchy Vegetables. Baked beans, kidney beans, lima beans, split peas, lentils, potatoes (with skin).   Grains. Whole wheat bread, brown rice, bran flake cereal, plain oatmeal, white rice, shredded wheat, bran muffins.   SEEK IMMEDIATE MEDICAL CARE IF:  You develop increasing pain or severe bloating.   You have an oral temperature above 101F.   You develop vomiting or bowel movements that are bloody or black.   Hemorrhoids Hemorrhoids are dilated (enlarged) veins around the rectum. Sometimes clots will form in the veins. This makes them swollen and painful. These are called thrombosed hemorrhoids. Causes of hemorrhoids include:  Constipation.   Straining to have a bowel movement.   HEAVY LIFTING HOME CARE INSTRUCTIONS  Eat a well balanced diet and drink 6 to 8 glasses of water every day to avoid constipation. You may also use a bulk laxative.   Avoid straining to have bowel movements.   Keep anal area dry and clean.   Do not use a donut shaped pillow or sit on the toilet for long periods. This increases blood pooling and pain.   Move your bowels when your body has the urge; this will require less straining and will decrease pain and pressure.

## 2015-03-03 NOTE — H&P (Signed)
Primary Care Physician:  Alonza Bogus, MD Primary Gastroenterologist:  Dr. Oneida Alar  Pre-Procedure History & Physical: HPI:  Ricardo Walter is a 64 y.o. male here for [place].  Past Medical History  Diagnosis Date  . Hypertension   . Coronary artery disease   . Diabetes mellitus without complication   . GERD (gastroesophageal reflux disease)   . Chronic back pain     Past Surgical History  Procedure Laterality Date  . Coronary artery bypass graft    . Cervical spine surgery      X 6  . Back surgery      X 3    Prior to Admission medications   Medication Sig Start Date End Date Taking? Authorizing Provider  amLODipine (NORVASC) 10 MG tablet Take 10 mg by mouth daily.   Yes Historical Provider, MD  aspirin 81 MG tablet Take 81 mg by mouth daily.   Yes Historical Provider, MD  aspirin EC 81 MG tablet Take 81 mg by mouth daily.   Yes Historical Provider, MD  cyclobenzaprine (FLEXERIL) 10 MG tablet Take 10 mg by mouth 2 (two) times daily.   Yes Historical Provider, MD  enalapril (VASOTEC) 20 MG tablet Take 20 mg by mouth daily.   Yes Historical Provider, MD  hydrochlorothiazide (HYDRODIURIL) 12.5 MG tablet Take 12.5 mg by mouth daily.   Yes Historical Provider, MD  metFORMIN (GLUCOPHAGE) 500 MG tablet Take 500 mg by mouth 2 (two) times daily with a meal.    Yes Historical Provider, MD  metoprolol succinate (TOPROL-XL) 25 MG 24 hr tablet Take 25 mg by mouth daily.   Yes Historical Provider, MD  polyethylene glycol-electrolytes (TRILYTE) 420 G solution Take 4,000 mLs by mouth as directed. 02/23/15  Yes Danie Binder, MD  pravastatin (PRAVACHOL) 80 MG tablet Take 80 mg by mouth daily.   Yes Historical Provider, MD  tamsulosin (FLOMAX) 0.4 MG CAPS capsule Take 0.4 mg by mouth.   Yes Historical Provider, MD  HYDROcodone-acetaminophen (NORCO) 10-325 MG per tablet Take 1 tablet by mouth every 6 (six) hours as needed (pain).     Historical Provider, MD  Sod Picosulfate-Mag  Ox-Cit Acd 10-3.5-12 MG-GM-GM PACK Take 1 Container by mouth as directed. 02/21/15   Danie Binder, MD    Allergies as of 02/21/2015  . (No Known Allergies)    History reviewed. No pertinent family history.  History   Social History  . Marital Status: Married    Spouse Name: N/A  . Number of Children: N/A  . Years of Education: N/A   Occupational History  . Not on file.   Social History Main Topics  . Smoking status: Former Smoker -- 1.00 packs/day for 20 years    Types: Cigarettes  . Smokeless tobacco: Not on file  . Alcohol Use: No  . Drug Use: No  . Sexual Activity: Not on file   Other Topics Concern  . Not on file   Social History Narrative  . No narrative on file    Review of Systems: See HPI, otherwise negative ROS   Physical Exam: BP 156/76 mmHg  Pulse 61  Temp(Src) 97.5 F (36.4 C) (Oral)  Resp 16  Ht 5\' 10"  (1.778 m)  Wt 214 lb (97.07 kg)  BMI 30.71 kg/m2  SpO2 97% General:   Alert,  pleasant and cooperative in NAD Head:  Normocephalic and atraumatic. Neck:  Supple; Lungs:  Clear throughout to auscultation.    Heart:  Regular rate and rhythm. Abdomen:  Soft, nontender and nondistended. Normal bowel sounds, without guarding, and without rebound.   Neurologic:  Alert and  oriented x4;  grossly normal neurologically.  Impression/Plan:    SCREENING  Plan:  1. TCS TODAY

## 2015-03-06 ENCOUNTER — Encounter (HOSPITAL_COMMUNITY): Payer: Self-pay | Admitting: Gastroenterology

## 2015-03-08 DIAGNOSIS — M4806 Spinal stenosis, lumbar region: Secondary | ICD-10-CM | POA: Diagnosis not present

## 2015-03-20 ENCOUNTER — Telehealth: Payer: Self-pay | Admitting: Gastroenterology

## 2015-03-20 NOTE — Telephone Encounter (Signed)
Please call pt. HE had simple adenomas removed.   DRINK WATER TO KEEP YOUR URINE LIGHT YELLOW.  FOLLOW A HIGH FIBER DIET. AVOID ITEMS THAT CAUSE BLOATING.   Next colonoscopy in 3 years. YOUR SISTERS, BROTHERS, CHILDREN, AND PARENTS NEED TO HAVE A COLONOSCOPY STARTING AT THE AGE OF 40.

## 2015-03-21 NOTE — Telephone Encounter (Signed)
REMINDER IN EPIC °

## 2015-03-21 NOTE — Telephone Encounter (Signed)
Pt is aware of results. 

## 2015-04-26 DIAGNOSIS — I2510 Atherosclerotic heart disease of native coronary artery without angina pectoris: Secondary | ICD-10-CM | POA: Diagnosis not present

## 2015-04-26 DIAGNOSIS — E785 Hyperlipidemia, unspecified: Secondary | ICD-10-CM | POA: Diagnosis not present

## 2015-04-26 DIAGNOSIS — I119 Hypertensive heart disease without heart failure: Secondary | ICD-10-CM | POA: Diagnosis not present

## 2015-05-11 DIAGNOSIS — M545 Low back pain: Secondary | ICD-10-CM | POA: Diagnosis not present

## 2015-05-11 DIAGNOSIS — Z23 Encounter for immunization: Secondary | ICD-10-CM | POA: Diagnosis not present

## 2015-05-11 DIAGNOSIS — M4712 Other spondylosis with myelopathy, cervical region: Secondary | ICD-10-CM | POA: Diagnosis not present

## 2015-05-11 DIAGNOSIS — E1165 Type 2 diabetes mellitus with hyperglycemia: Secondary | ICD-10-CM | POA: Diagnosis not present

## 2015-05-11 DIAGNOSIS — I2510 Atherosclerotic heart disease of native coronary artery without angina pectoris: Secondary | ICD-10-CM | POA: Diagnosis not present

## 2015-08-15 DIAGNOSIS — I25119 Atherosclerotic heart disease of native coronary artery with unspecified angina pectoris: Secondary | ICD-10-CM | POA: Diagnosis not present

## 2015-08-15 DIAGNOSIS — E1165 Type 2 diabetes mellitus with hyperglycemia: Secondary | ICD-10-CM | POA: Diagnosis not present

## 2015-08-15 DIAGNOSIS — I10 Essential (primary) hypertension: Secondary | ICD-10-CM | POA: Diagnosis not present

## 2015-08-15 DIAGNOSIS — J449 Chronic obstructive pulmonary disease, unspecified: Secondary | ICD-10-CM | POA: Diagnosis not present

## 2015-09-05 DIAGNOSIS — H35363 Drusen (degenerative) of macula, bilateral: Secondary | ICD-10-CM | POA: Diagnosis not present

## 2015-09-12 DIAGNOSIS — M4726 Other spondylosis with radiculopathy, lumbar region: Secondary | ICD-10-CM | POA: Diagnosis not present

## 2015-10-18 ENCOUNTER — Other Ambulatory Visit (HOSPITAL_COMMUNITY): Payer: Self-pay | Admitting: Pulmonary Disease

## 2015-10-18 ENCOUNTER — Ambulatory Visit (HOSPITAL_COMMUNITY)
Admission: RE | Admit: 2015-10-18 | Discharge: 2015-10-18 | Disposition: A | Discharge status: Home / Self Care | Payer: Medicare Other | Source: Ambulatory Visit | Attending: Pulmonary Disease | Admitting: Pulmonary Disease

## 2015-10-18 DIAGNOSIS — R600 Localized edema: Secondary | ICD-10-CM

## 2015-10-18 DIAGNOSIS — I25119 Atherosclerotic heart disease of native coronary artery with unspecified angina pectoris: Secondary | ICD-10-CM | POA: Diagnosis not present

## 2015-10-18 DIAGNOSIS — J449 Chronic obstructive pulmonary disease, unspecified: Secondary | ICD-10-CM | POA: Diagnosis not present

## 2015-10-18 DIAGNOSIS — E119 Type 2 diabetes mellitus without complications: Secondary | ICD-10-CM | POA: Diagnosis not present

## 2015-10-23 DIAGNOSIS — E119 Type 2 diabetes mellitus without complications: Secondary | ICD-10-CM | POA: Diagnosis not present

## 2015-10-23 DIAGNOSIS — I25119 Atherosclerotic heart disease of native coronary artery with unspecified angina pectoris: Secondary | ICD-10-CM | POA: Diagnosis not present

## 2015-10-23 DIAGNOSIS — R600 Localized edema: Secondary | ICD-10-CM | POA: Diagnosis not present

## 2015-10-23 DIAGNOSIS — J449 Chronic obstructive pulmonary disease, unspecified: Secondary | ICD-10-CM | POA: Diagnosis not present

## 2015-10-23 LAB — MICROALBUMIN, URINE: Microalb, Ur: 1.3

## 2015-10-30 ENCOUNTER — Other Ambulatory Visit: Payer: Self-pay | Admitting: Acute Care

## 2015-10-30 ENCOUNTER — Encounter: Payer: Self-pay | Admitting: Acute Care

## 2015-10-30 ENCOUNTER — Ambulatory Visit (INDEPENDENT_AMBULATORY_CARE_PROVIDER_SITE_OTHER): Payer: Medicare Other | Admitting: Acute Care

## 2015-10-30 ENCOUNTER — Ambulatory Visit (INDEPENDENT_AMBULATORY_CARE_PROVIDER_SITE_OTHER)
Admission: RE | Admit: 2015-10-30 | Discharge: 2015-10-30 | Disposition: A | Discharge status: Home / Self Care | Payer: Medicare Other | Source: Ambulatory Visit | Attending: Acute Care | Admitting: Acute Care

## 2015-10-30 DIAGNOSIS — Z87891 Personal history of nicotine dependence: Secondary | ICD-10-CM

## 2015-10-30 NOTE — Progress Notes (Signed)
Shared Decision Making Visit Lung Cancer Screening Program [phone])   Eligibility:  Age 65 y.o.  Pack Years Smoking History Calculation 45 pack year (# packs/per year x # years smoked)  Recent History of coughing up blood  no  Unexplained weight loss? no ( >Than 15 pounds within the last 6 months )  Prior History Lung / other cancer no (Diagnosis within the last 5 years already requiring surveillance chest CT Scans).  Smoking Status Former Smoker  Former Smokers: Years since quit: 1 year  Quit Date:06/2014  Visit Components:  Discussion included one or more decision making aids. yes  Discussion included risk/benefits of screening. yes  Discussion included potential follow up diagnostic testing for abnormal scans. yes  Discussion included meaning and risk of over diagnosis. yes  Discussion included meaning and risk of False Positives. yes  Discussion included meaning of total radiation exposure. yes  Counseling Included:  Importance of adherence to annual lung cancer LDCT screening. yes  Impact of comorbidities on ability to participate in the program. yes  Ability and willingness to under diagnostic treatment. yes  Smoking Cessation Counseling:  Current Smokers:   Discussed importance of smoking cessation.NA; Former smoker  Information about tobacco cessation classes and interventions provided to patient. NA  Patient provided with "ticket" for LDCT Scan. yes  Symptomatic Patient. no  Counseling:  Diagnosis Code: Tobacco Use Z72.0  Asymptomatic Patient yes  Counseling   Former Smokers:   Discussed the importance of maintaining cigarette abstinence. yes  Diagnosis Code: Personal History of Nicotine Dependence. [id number]  Information about tobacco cessation classes and interventions provided to patient. Yes  Patient provided with "ticket" for LDCT Scan. yes  Written Order for Lung Cancer Screening with LDCT placed in Epic. Yes (CT Chest Lung Cancer  Screening Low Dose W/O CM) [id number] Z12.2-Screening of respiratory organs Z87.891-Personal history of nicotine dependence  I spent 15 minutes of face to face time with Mr. Packwood discussing the risks and benefits of lung cancer screening. We viewed a power point together that explained in detail the above noted topics. We took the time to pause the power point at intervals to allow for questions to be asked and answered to ensure understanding. We discussed that he  had taken the single most powerful action possible to decrease his risk of developing lung cancer when he quit smoking. I counseled him to remain smoke free, and to contact me if he ever had the desire to smoke again so that I can provide resources and tools to help support the effort to remain smoke free. We discussed the time and location of the scan, and that either [place] or I will call with the results within  24-48 hours of receiving them. He has my card and contact information in the event he needs to speak with me, in addition to a copy of the power point we reviewed as a resource. He verbalized understanding of all of the above and had no further questions upon leaving the office.    Magdalen Spatz, NP

## 2015-10-31 ENCOUNTER — Other Ambulatory Visit: Payer: Self-pay | Admitting: Acute Care

## 2015-10-31 DIAGNOSIS — F17210 Nicotine dependence, cigarettes, uncomplicated: Secondary | ICD-10-CM

## 2015-11-07 ENCOUNTER — Telehealth: Payer: Self-pay | Admitting: Acute Care

## 2015-11-07 NOTE — Telephone Encounter (Signed)
I have spoken with Ricardo Walter. He is aware of the fact that he has appointment to see Dr. Luan Pulling Thursday, February 23 at 2 PM. He verbalized understanding that he will call me and let me know date and time of antibiotic treatment being completed so that I can schedule the follow-up CT scan. He verbalized understanding of the above and had no further questions at the time we ended the call.

## 2015-11-09 DIAGNOSIS — E119 Type 2 diabetes mellitus without complications: Secondary | ICD-10-CM | POA: Diagnosis not present

## 2015-11-09 DIAGNOSIS — J441 Chronic obstructive pulmonary disease with (acute) exacerbation: Secondary | ICD-10-CM | POA: Diagnosis not present

## 2015-11-09 DIAGNOSIS — I10 Essential (primary) hypertension: Secondary | ICD-10-CM | POA: Diagnosis not present

## 2015-11-21 ENCOUNTER — Telehealth: Payer: Self-pay | Admitting: Acute Care

## 2015-11-21 NOTE — Telephone Encounter (Signed)
I called Ricardo Walter as a follow up to his appointment with Dr. Luan Pulling. Per his screening CT done 10/30/15, he has seen Dr. Luan Pulling and has been on Levaquin. His levaquin therapy will be completed on 11/26/15, and he is scheduled for a repeat LDCT on 11/27/15 per the radiology recommendation.He verbalized understanding of the above and had no further questions.I told him I would call with scan results as soon as I have them.

## 2015-11-27 ENCOUNTER — Ambulatory Visit (INDEPENDENT_AMBULATORY_CARE_PROVIDER_SITE_OTHER)
Admission: RE | Admit: 2015-11-27 | Discharge: 2015-11-27 | Disposition: A | Discharge status: Home / Self Care | Payer: Medicare Other | Source: Ambulatory Visit | Attending: Acute Care | Admitting: Acute Care

## 2015-11-27 DIAGNOSIS — R918 Other nonspecific abnormal finding of lung field: Secondary | ICD-10-CM | POA: Diagnosis not present

## 2015-11-27 DIAGNOSIS — F17210 Nicotine dependence, cigarettes, uncomplicated: Secondary | ICD-10-CM | POA: Diagnosis not present

## 2015-11-30 DIAGNOSIS — I10 Essential (primary) hypertension: Secondary | ICD-10-CM | POA: Diagnosis not present

## 2015-11-30 DIAGNOSIS — E785 Hyperlipidemia, unspecified: Secondary | ICD-10-CM | POA: Diagnosis not present

## 2015-11-30 DIAGNOSIS — I2510 Atherosclerotic heart disease of native coronary artery without angina pectoris: Secondary | ICD-10-CM | POA: Diagnosis not present

## 2015-12-04 ENCOUNTER — Telehealth: Payer: Self-pay | Admitting: Acute Care

## 2015-12-04 NOTE — Telephone Encounter (Signed)
I called Ricardo Walter to make sure he had had a follow-up appointment with Dr. Sinda Du regarding the suggestion for continued antibiotic therapy based on the CT imaging done 11/27/2015. There was no answer. I left a message requesting that he give me a call tomorrow morning so that I can sure he has had the appropriate follow-up. I will await his return call

## 2015-12-05 ENCOUNTER — Other Ambulatory Visit: Payer: Self-pay | Admitting: Acute Care

## 2015-12-06 ENCOUNTER — Other Ambulatory Visit: Payer: Self-pay | Admitting: Acute Care

## 2015-12-06 DIAGNOSIS — Z87891 Personal history of nicotine dependence: Secondary | ICD-10-CM

## 2015-12-18 DIAGNOSIS — I25119 Atherosclerotic heart disease of native coronary artery with unspecified angina pectoris: Secondary | ICD-10-CM | POA: Diagnosis not present

## 2015-12-18 DIAGNOSIS — E119 Type 2 diabetes mellitus without complications: Secondary | ICD-10-CM | POA: Diagnosis not present

## 2015-12-18 DIAGNOSIS — M545 Low back pain: Secondary | ICD-10-CM | POA: Diagnosis not present

## 2015-12-18 DIAGNOSIS — J441 Chronic obstructive pulmonary disease with (acute) exacerbation: Secondary | ICD-10-CM | POA: Diagnosis not present

## 2016-01-17 DIAGNOSIS — J449 Chronic obstructive pulmonary disease, unspecified: Secondary | ICD-10-CM | POA: Diagnosis not present

## 2016-01-17 DIAGNOSIS — E119 Type 2 diabetes mellitus without complications: Secondary | ICD-10-CM | POA: Diagnosis not present

## 2016-01-17 DIAGNOSIS — J189 Pneumonia, unspecified organism: Secondary | ICD-10-CM | POA: Diagnosis not present

## 2016-01-17 DIAGNOSIS — I25119 Atherosclerotic heart disease of native coronary artery with unspecified angina pectoris: Secondary | ICD-10-CM | POA: Diagnosis not present

## 2016-03-14 DIAGNOSIS — H25813 Combined forms of age-related cataract, bilateral: Secondary | ICD-10-CM | POA: Diagnosis not present

## 2016-04-30 DIAGNOSIS — M545 Low back pain: Secondary | ICD-10-CM | POA: Diagnosis not present

## 2016-04-30 DIAGNOSIS — E119 Type 2 diabetes mellitus without complications: Secondary | ICD-10-CM | POA: Diagnosis not present

## 2016-04-30 DIAGNOSIS — I25119 Atherosclerotic heart disease of native coronary artery with unspecified angina pectoris: Secondary | ICD-10-CM | POA: Diagnosis not present

## 2016-04-30 DIAGNOSIS — J449 Chronic obstructive pulmonary disease, unspecified: Secondary | ICD-10-CM | POA: Diagnosis not present

## 2016-05-28 DIAGNOSIS — H269 Unspecified cataract: Secondary | ICD-10-CM | POA: Diagnosis not present

## 2016-05-28 DIAGNOSIS — H25812 Combined forms of age-related cataract, left eye: Secondary | ICD-10-CM | POA: Diagnosis not present

## 2016-05-29 DIAGNOSIS — H25811 Combined forms of age-related cataract, right eye: Secondary | ICD-10-CM | POA: Diagnosis not present

## 2016-06-04 IMAGING — CT CT CHEST LUNG CANCER SCREENING LOW DOSE W/O CM
2 of 4 series · 15 of 40 positions shown, 18 images · non-contrast
Comparison: None.

CLINICAL DATA: One month followup low-dose screening exam.

EXAM:
CT CHEST WITHOUT CONTRAST LOW-DOSE FOR LUNG CANCER SCREENING
TECHNIQUE: Multidetector CT imaging of the chest was performed following the
standard protocol without IV contrast.

[Series 2: thorax 5.0 i31f 3 · axial · 0.85mm/px · z∈[-356,-80]mm · 12 of 67 slices shown, 15 images]
[im 6/67  mediastinal]
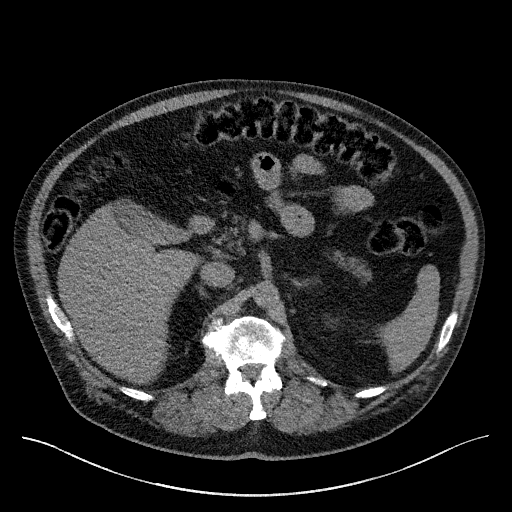
[im 6/67  lung]
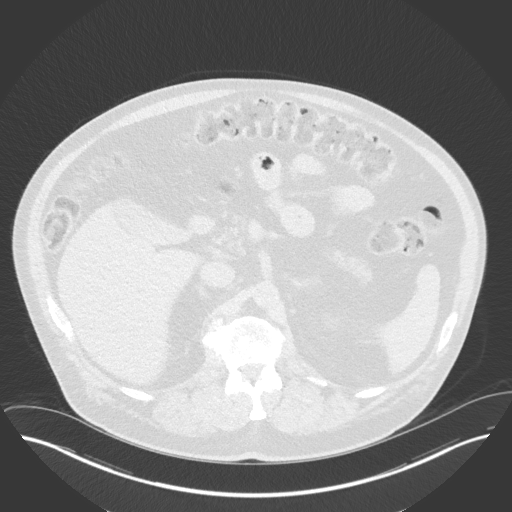
[im 11/67  lung]
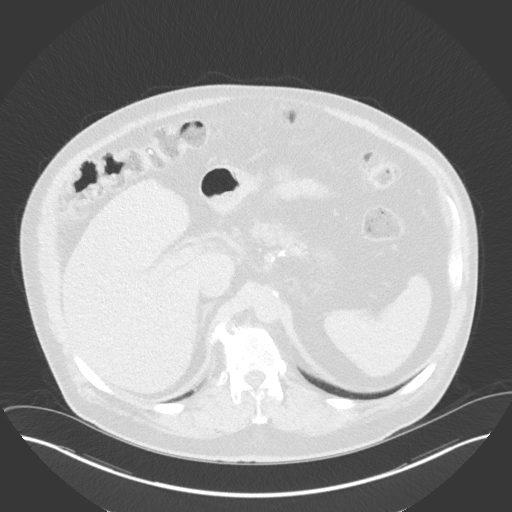
[im 16/67  lung]
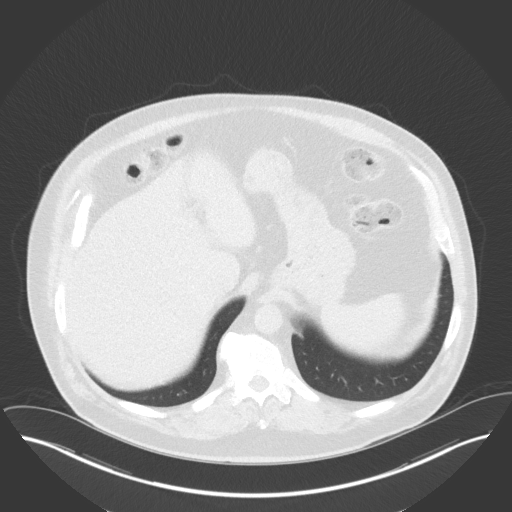
[im 21/67  lung]
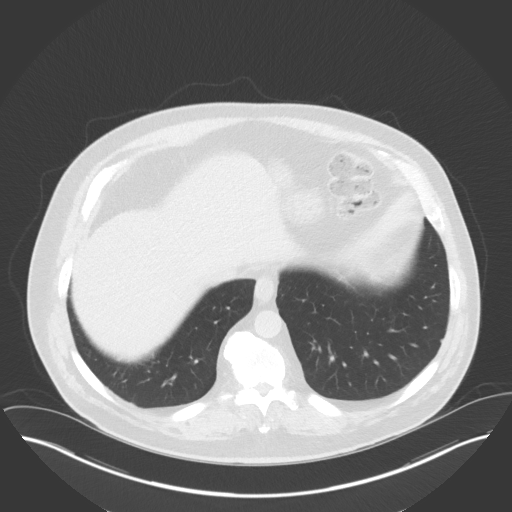
[im 26/67  mediastinal]
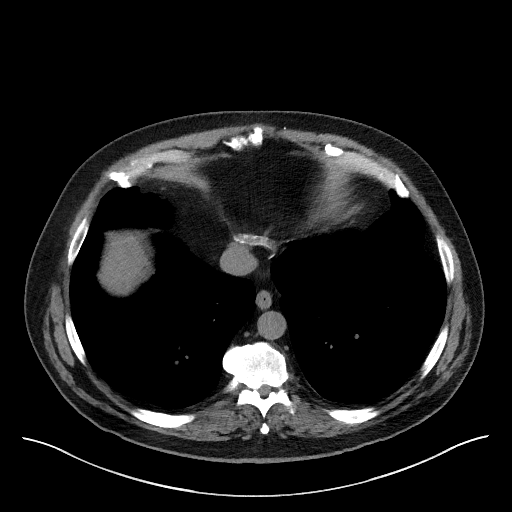
[im 26/67  lung]
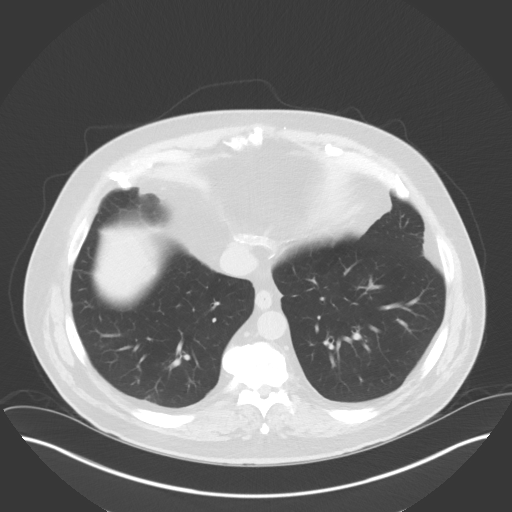
[im 31/67  lung]
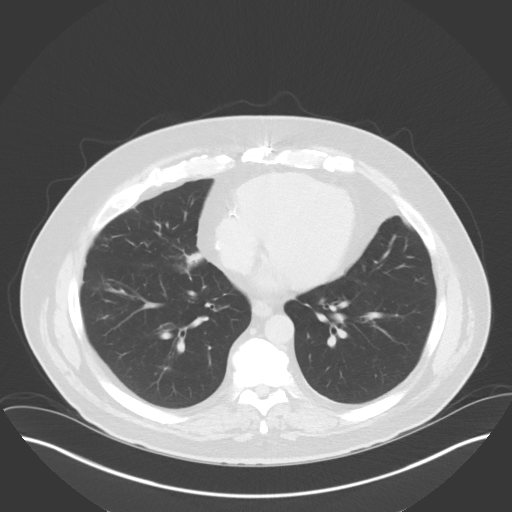
[im 36/67  lung]
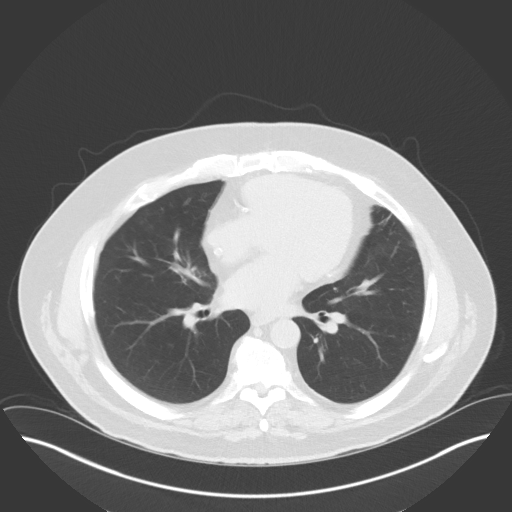
[im 41/67  lung]
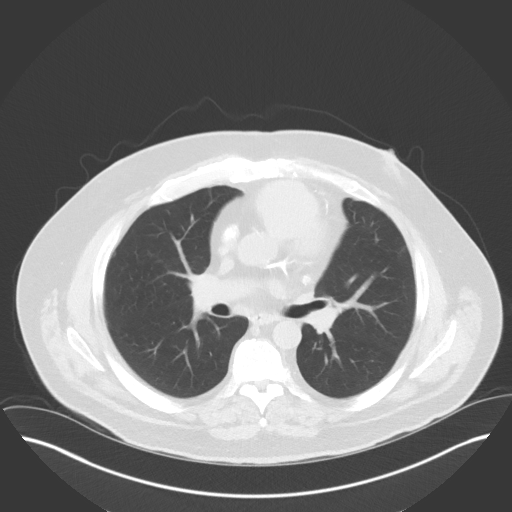
[im 46/67  mediastinal]
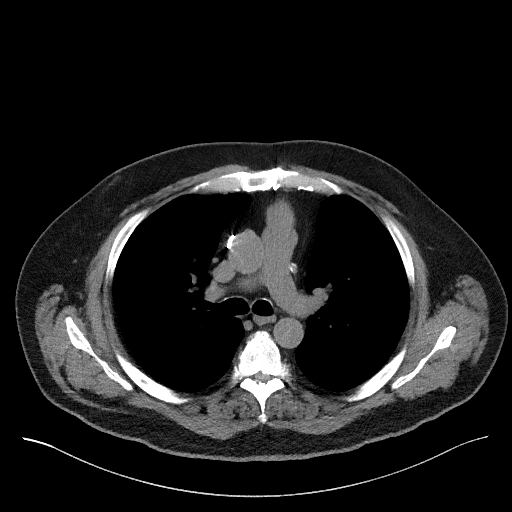
[im 46/67  lung]
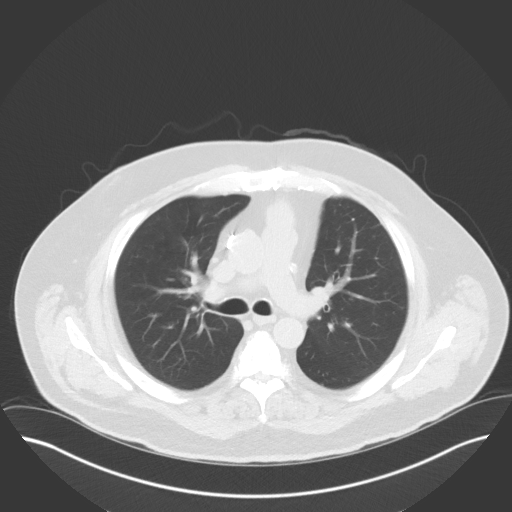
[im 51/67  lung]
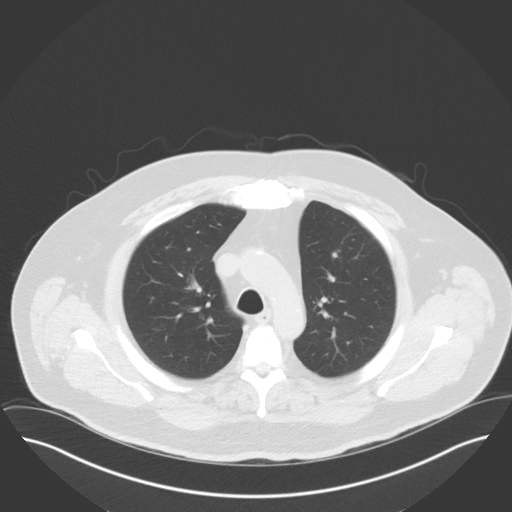
[im 56/67  lung]
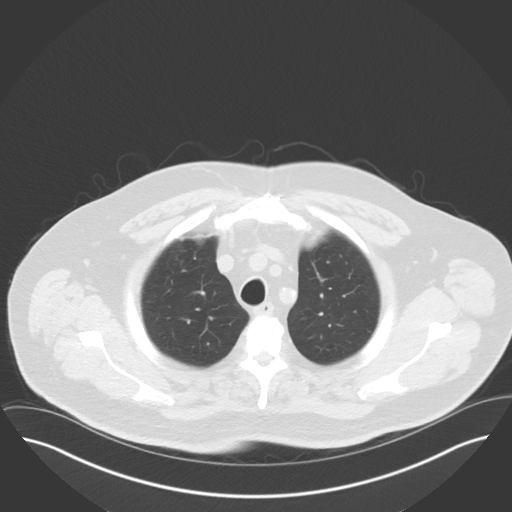
[im 61/67  lung]
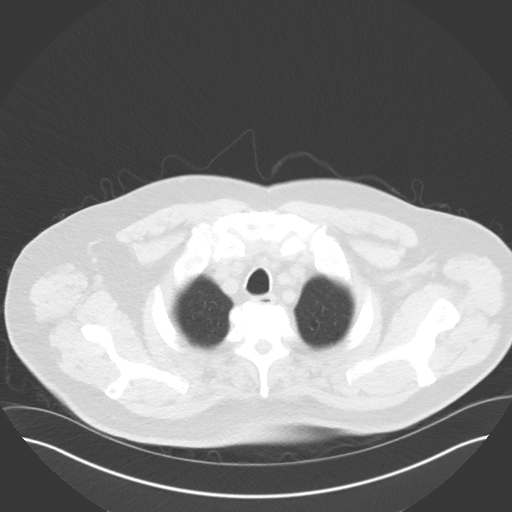

[Series 5: coronal · coronal · 0.65mm/px · 3 of 156 slices shown]
[im 32/156  lung]
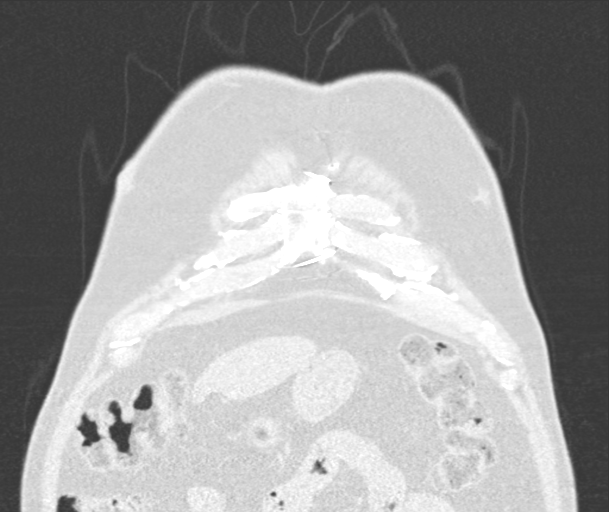
[im 63/156  lung]
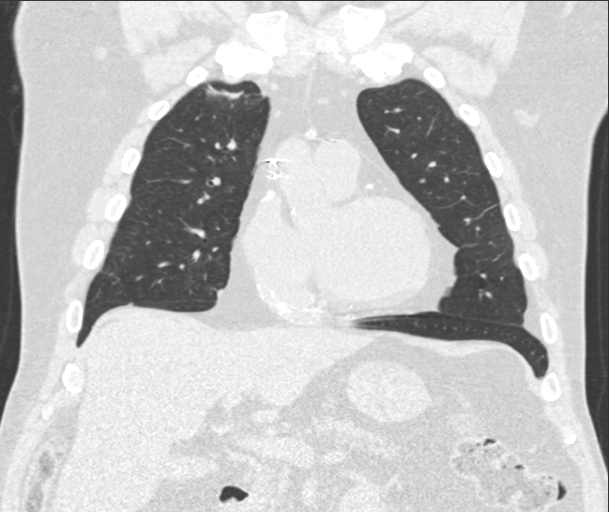
[im 94/156  lung]
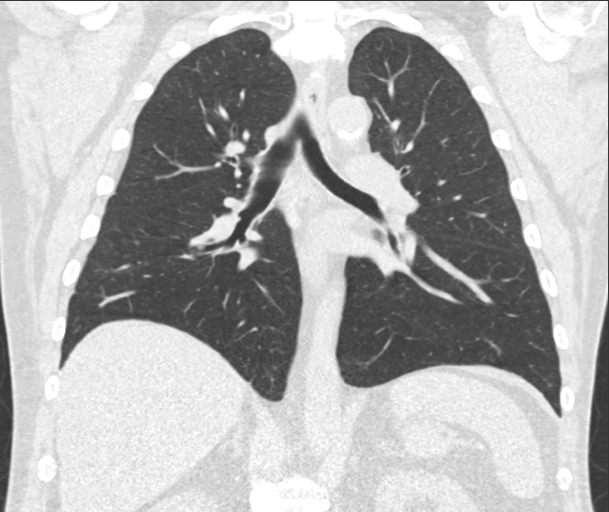

[15 of 40 positions shown; findings below may reference images not displayed]

FINDINGS: Mediastinum/Nodes: The heart size appears normal. There is no
pericardial effusion identified. Previous median sternotomy and CABG
procedure. No mediastinal adenopathy identified.

Lungs/Pleura: There is no pleural fluid identified. Resolving area
of pneumonitis within the anterior right upper lobe is identified
with residual, linear scar like densities noted on the current exam,
image 63 of series 3. There is a new area of nodular consolidation
within the medial right middle lobe, image 180 of series 3.

Upper abdomen: The adrenal glands are normal. Normal appearance of
the liver. The gallbladder appears normal. No acute findings
identified within the upper abdomen. No aggressive lytic or
sclerotic bone lesions.

Musculoskeletal: Spondylosis is noted within the thoracic spine.
IMPRESSION: 1. Lung-Rads category 3, probably benign findings. Short-term
follow-up in 6 months is recommended with repeat low-dose chest CT
without contrast (please use the following order, "CT CHEST LCS
NODULE FOLLOW-UP W/O CM").
2. Since the new area of nodular consolidation has occurred within 1
month of the previous CT this is favored to represent sequelae of
inflammation or infection. Ongoing antibiotic therapy is suggested
and, as mentioned above 8 short-term follow-up at 6 months is
recommended.
3. Aortic atherosclerosis.  Previous CABG procedure.

## 2016-06-11 DIAGNOSIS — H269 Unspecified cataract: Secondary | ICD-10-CM | POA: Diagnosis not present

## 2016-06-11 DIAGNOSIS — H25811 Combined forms of age-related cataract, right eye: Secondary | ICD-10-CM | POA: Diagnosis not present

## 2016-07-31 DIAGNOSIS — Z23 Encounter for immunization: Secondary | ICD-10-CM | POA: Diagnosis not present

## 2016-07-31 DIAGNOSIS — I10 Essential (primary) hypertension: Secondary | ICD-10-CM | POA: Diagnosis not present

## 2016-07-31 DIAGNOSIS — I739 Peripheral vascular disease, unspecified: Secondary | ICD-10-CM | POA: Diagnosis not present

## 2016-07-31 DIAGNOSIS — J449 Chronic obstructive pulmonary disease, unspecified: Secondary | ICD-10-CM | POA: Diagnosis not present

## 2016-07-31 DIAGNOSIS — I25119 Atherosclerotic heart disease of native coronary artery with unspecified angina pectoris: Secondary | ICD-10-CM | POA: Diagnosis not present

## 2016-08-01 ENCOUNTER — Telehealth: Payer: Self-pay | Admitting: Acute Care

## 2016-08-01 NOTE — Telephone Encounter (Signed)
I have called Dr. Sinda Du office and spoken with [facility]. Ricardo Walter opted not to have his 6 month follow-up CT scan through the screening program as he feared he would be charged for it. His Medicare had denied the previous scan. This scan cost was written off by [place]. I have requested that Dr. Luan Pulling order the follow-up CT chest without contrast for nodule follow-up so that the patient gets the care that he needs and the required follow-up regardless of where he has done. Per Jacqlyn Larsen at Dr. Luan Pulling office they will place the order, and they will fax me the results once it has been completed. I have provided him with my office contact information and fax number.

## 2016-09-26 DIAGNOSIS — Z961 Presence of intraocular lens: Secondary | ICD-10-CM | POA: Diagnosis not present

## 2016-10-31 DIAGNOSIS — J449 Chronic obstructive pulmonary disease, unspecified: Secondary | ICD-10-CM | POA: Diagnosis not present

## 2016-10-31 DIAGNOSIS — Z79891 Long term (current) use of opiate analgesic: Secondary | ICD-10-CM | POA: Diagnosis not present

## 2016-10-31 DIAGNOSIS — Z125 Encounter for screening for malignant neoplasm of prostate: Secondary | ICD-10-CM | POA: Diagnosis not present

## 2016-10-31 DIAGNOSIS — M545 Low back pain: Secondary | ICD-10-CM | POA: Diagnosis not present

## 2016-10-31 DIAGNOSIS — N139 Obstructive and reflux uropathy, unspecified: Secondary | ICD-10-CM | POA: Diagnosis not present

## 2016-10-31 DIAGNOSIS — N138 Other obstructive and reflux uropathy: Secondary | ICD-10-CM | POA: Diagnosis not present

## 2016-10-31 DIAGNOSIS — M542 Cervicalgia: Secondary | ICD-10-CM | POA: Diagnosis not present

## 2016-10-31 DIAGNOSIS — I25119 Atherosclerotic heart disease of native coronary artery with unspecified angina pectoris: Secondary | ICD-10-CM | POA: Diagnosis not present

## 2016-12-12 DIAGNOSIS — R69 Illness, unspecified: Secondary | ICD-10-CM | POA: Diagnosis not present

## 2016-12-12 DIAGNOSIS — I2510 Atherosclerotic heart disease of native coronary artery without angina pectoris: Secondary | ICD-10-CM | POA: Diagnosis not present

## 2016-12-12 DIAGNOSIS — I10 Essential (primary) hypertension: Secondary | ICD-10-CM | POA: Diagnosis not present

## 2016-12-12 DIAGNOSIS — E785 Hyperlipidemia, unspecified: Secondary | ICD-10-CM | POA: Diagnosis not present

## 2017-02-03 DIAGNOSIS — R69 Illness, unspecified: Secondary | ICD-10-CM | POA: Diagnosis not present

## 2017-02-03 DIAGNOSIS — Z0000 Encounter for general adult medical examination without abnormal findings: Secondary | ICD-10-CM | POA: Diagnosis not present

## 2017-05-06 ENCOUNTER — Encounter: Payer: Self-pay | Admitting: Family Medicine

## 2017-05-06 DIAGNOSIS — M545 Low back pain: Secondary | ICD-10-CM | POA: Diagnosis not present

## 2017-05-06 DIAGNOSIS — E119 Type 2 diabetes mellitus without complications: Secondary | ICD-10-CM | POA: Diagnosis not present

## 2017-05-06 DIAGNOSIS — E785 Hyperlipidemia, unspecified: Secondary | ICD-10-CM | POA: Diagnosis not present

## 2017-05-06 DIAGNOSIS — J449 Chronic obstructive pulmonary disease, unspecified: Secondary | ICD-10-CM | POA: Diagnosis not present

## 2017-06-12 DIAGNOSIS — I10 Essential (primary) hypertension: Secondary | ICD-10-CM | POA: Diagnosis not present

## 2017-06-12 DIAGNOSIS — I2510 Atherosclerotic heart disease of native coronary artery without angina pectoris: Secondary | ICD-10-CM | POA: Diagnosis not present

## 2017-06-12 DIAGNOSIS — E785 Hyperlipidemia, unspecified: Secondary | ICD-10-CM | POA: Diagnosis not present

## 2017-07-18 DIAGNOSIS — Z79891 Long term (current) use of opiate analgesic: Secondary | ICD-10-CM | POA: Diagnosis not present

## 2017-07-24 DIAGNOSIS — R69 Illness, unspecified: Secondary | ICD-10-CM | POA: Diagnosis not present

## 2017-08-11 DIAGNOSIS — J449 Chronic obstructive pulmonary disease, unspecified: Secondary | ICD-10-CM | POA: Diagnosis not present

## 2017-08-11 DIAGNOSIS — I25119 Atherosclerotic heart disease of native coronary artery with unspecified angina pectoris: Secondary | ICD-10-CM | POA: Diagnosis not present

## 2017-08-11 DIAGNOSIS — E119 Type 2 diabetes mellitus without complications: Secondary | ICD-10-CM | POA: Diagnosis not present

## 2017-08-11 DIAGNOSIS — M545 Low back pain: Secondary | ICD-10-CM | POA: Diagnosis not present

## 2017-08-11 DIAGNOSIS — Z23 Encounter for immunization: Secondary | ICD-10-CM | POA: Diagnosis not present

## 2018-02-11 DIAGNOSIS — E1169 Type 2 diabetes mellitus with other specified complication: Secondary | ICD-10-CM | POA: Diagnosis not present

## 2018-02-11 DIAGNOSIS — J449 Chronic obstructive pulmonary disease, unspecified: Secondary | ICD-10-CM | POA: Diagnosis not present

## 2018-02-11 DIAGNOSIS — Z79891 Long term (current) use of opiate analgesic: Secondary | ICD-10-CM | POA: Diagnosis not present

## 2018-02-11 DIAGNOSIS — E1165 Type 2 diabetes mellitus with hyperglycemia: Secondary | ICD-10-CM | POA: Diagnosis not present

## 2018-02-11 DIAGNOSIS — I25119 Atherosclerotic heart disease of native coronary artery with unspecified angina pectoris: Secondary | ICD-10-CM | POA: Diagnosis not present

## 2018-02-11 DIAGNOSIS — M545 Low back pain: Secondary | ICD-10-CM | POA: Diagnosis not present

## 2018-02-19 ENCOUNTER — Encounter: Payer: Self-pay | Admitting: Gastroenterology

## 2018-02-19 DIAGNOSIS — I2510 Atherosclerotic heart disease of native coronary artery without angina pectoris: Secondary | ICD-10-CM | POA: Diagnosis not present

## 2018-02-19 DIAGNOSIS — E785 Hyperlipidemia, unspecified: Secondary | ICD-10-CM | POA: Diagnosis not present

## 2018-02-19 DIAGNOSIS — I10 Essential (primary) hypertension: Secondary | ICD-10-CM | POA: Diagnosis not present

## 2018-02-24 DIAGNOSIS — M170 Bilateral primary osteoarthritis of knee: Secondary | ICD-10-CM | POA: Diagnosis not present

## 2018-02-24 DIAGNOSIS — M47812 Spondylosis without myelopathy or radiculopathy, cervical region: Secondary | ICD-10-CM | POA: Diagnosis not present

## 2018-02-24 DIAGNOSIS — E119 Type 2 diabetes mellitus without complications: Secondary | ICD-10-CM | POA: Diagnosis not present

## 2018-02-24 DIAGNOSIS — E785 Hyperlipidemia, unspecified: Secondary | ICD-10-CM | POA: Diagnosis not present

## 2018-02-24 DIAGNOSIS — Z87891 Personal history of nicotine dependence: Secondary | ICD-10-CM | POA: Diagnosis not present

## 2018-02-24 DIAGNOSIS — E669 Obesity, unspecified: Secondary | ICD-10-CM | POA: Diagnosis not present

## 2018-02-24 DIAGNOSIS — N400 Enlarged prostate without lower urinary tract symptoms: Secondary | ICD-10-CM | POA: Diagnosis not present

## 2018-02-24 DIAGNOSIS — Z6830 Body mass index (BMI) 30.0-30.9, adult: Secondary | ICD-10-CM | POA: Diagnosis not present

## 2018-02-24 DIAGNOSIS — I10 Essential (primary) hypertension: Secondary | ICD-10-CM | POA: Diagnosis not present

## 2018-03-14 DIAGNOSIS — J449 Chronic obstructive pulmonary disease, unspecified: Secondary | ICD-10-CM | POA: Diagnosis not present

## 2018-03-14 DIAGNOSIS — E785 Hyperlipidemia, unspecified: Secondary | ICD-10-CM | POA: Diagnosis not present

## 2018-03-14 DIAGNOSIS — I25119 Atherosclerotic heart disease of native coronary artery with unspecified angina pectoris: Secondary | ICD-10-CM | POA: Diagnosis not present

## 2018-03-14 DIAGNOSIS — E1169 Type 2 diabetes mellitus with other specified complication: Secondary | ICD-10-CM | POA: Diagnosis not present

## 2018-03-14 DIAGNOSIS — Z23 Encounter for immunization: Secondary | ICD-10-CM | POA: Diagnosis not present

## 2018-08-12 ENCOUNTER — Encounter: Payer: Self-pay | Admitting: Family Medicine

## 2018-08-12 DIAGNOSIS — J449 Chronic obstructive pulmonary disease, unspecified: Secondary | ICD-10-CM | POA: Diagnosis not present

## 2018-08-12 DIAGNOSIS — E1169 Type 2 diabetes mellitus with other specified complication: Secondary | ICD-10-CM | POA: Diagnosis not present

## 2018-08-12 DIAGNOSIS — Z79891 Long term (current) use of opiate analgesic: Secondary | ICD-10-CM | POA: Diagnosis not present

## 2018-08-12 DIAGNOSIS — I25119 Atherosclerotic heart disease of native coronary artery with unspecified angina pectoris: Secondary | ICD-10-CM | POA: Diagnosis not present

## 2018-08-12 DIAGNOSIS — M545 Low back pain: Secondary | ICD-10-CM | POA: Diagnosis not present

## 2018-08-12 DIAGNOSIS — E119 Type 2 diabetes mellitus without complications: Secondary | ICD-10-CM | POA: Diagnosis not present

## 2018-11-12 DIAGNOSIS — E785 Hyperlipidemia, unspecified: Secondary | ICD-10-CM | POA: Diagnosis not present

## 2018-11-12 DIAGNOSIS — Z125 Encounter for screening for malignant neoplasm of prostate: Secondary | ICD-10-CM | POA: Diagnosis not present

## 2018-11-12 DIAGNOSIS — I25119 Atherosclerotic heart disease of native coronary artery with unspecified angina pectoris: Secondary | ICD-10-CM | POA: Diagnosis not present

## 2018-11-12 DIAGNOSIS — J449 Chronic obstructive pulmonary disease, unspecified: Secondary | ICD-10-CM | POA: Diagnosis not present

## 2018-11-12 DIAGNOSIS — I119 Hypertensive heart disease without heart failure: Secondary | ICD-10-CM | POA: Diagnosis not present

## 2018-11-12 DIAGNOSIS — I10 Essential (primary) hypertension: Secondary | ICD-10-CM | POA: Diagnosis not present

## 2018-11-12 DIAGNOSIS — E1169 Type 2 diabetes mellitus with other specified complication: Secondary | ICD-10-CM | POA: Diagnosis not present

## 2018-11-12 LAB — CBC AND DIFFERENTIAL
HCT: 35 — AB (ref 41–53)
Hemoglobin: 12.2 — AB (ref 13.5–17.5)
Platelets: 298 (ref 150–399)
WBC: 6.4

## 2018-11-12 LAB — COMPREHENSIVE METABOLIC PANEL
Albumin: 4.2 (ref 3.5–5.0)
Calcium: 9.5 (ref 8.7–10.7)
GFR calc Af Amer: 43
GFR calc non Af Amer: 37
Globulin: 2.6

## 2018-11-12 LAB — HEMOGLOBIN A1C: Hemoglobin A1C: 6.8

## 2018-11-12 LAB — BASIC METABOLIC PANEL
BUN: 20 (ref 4–21)
CO2: 28 — AB (ref 13–22)
Chloride: 101 (ref 99–108)
Creatinine: 1.9 — AB (ref <=1.3)
Glucose: 112
Potassium: 5.2 (ref 3.4–5.3)
Sodium: 137 (ref 137–147)

## 2018-11-12 LAB — HEPATIC FUNCTION PANEL
ALT: 23 (ref 10–40)
AST: 21 (ref 14–40)
Alkaline Phosphatase: 51 (ref 25–125)

## 2018-11-12 LAB — LIPID PANEL
Cholesterol: 174 (ref 0–200)
HDL: 31 — AB (ref 35–70)
LDL Cholesterol: 102
Triglycerides: 307 — AB (ref 40–160)

## 2018-11-12 LAB — CBC: RBC: 3.86 — AB (ref 3.87–5.11)

## 2018-11-12 LAB — PSA: PSA: 1

## 2019-02-10 DIAGNOSIS — Z0000 Encounter for general adult medical examination without abnormal findings: Secondary | ICD-10-CM | POA: Diagnosis not present

## 2019-02-10 LAB — HM DIABETES FOOT EXAM

## 2019-02-19 DIAGNOSIS — Z1211 Encounter for screening for malignant neoplasm of colon: Secondary | ICD-10-CM | POA: Diagnosis not present

## 2019-02-19 DIAGNOSIS — Z1212 Encounter for screening for malignant neoplasm of rectum: Secondary | ICD-10-CM | POA: Diagnosis not present

## 2019-02-19 LAB — COLOGUARD: Cologuard: NEGATIVE

## 2019-05-13 ENCOUNTER — Encounter: Payer: Self-pay | Admitting: Family Medicine

## 2019-05-13 DIAGNOSIS — Z79891 Long term (current) use of opiate analgesic: Secondary | ICD-10-CM | POA: Diagnosis not present

## 2019-05-13 DIAGNOSIS — I2510 Atherosclerotic heart disease of native coronary artery without angina pectoris: Secondary | ICD-10-CM | POA: Diagnosis not present

## 2019-05-13 DIAGNOSIS — J449 Chronic obstructive pulmonary disease, unspecified: Secondary | ICD-10-CM | POA: Diagnosis not present

## 2019-05-13 DIAGNOSIS — E1169 Type 2 diabetes mellitus with other specified complication: Secondary | ICD-10-CM | POA: Diagnosis not present

## 2019-05-13 DIAGNOSIS — I10 Essential (primary) hypertension: Secondary | ICD-10-CM | POA: Diagnosis not present

## 2019-06-19 DIAGNOSIS — I10 Essential (primary) hypertension: Secondary | ICD-10-CM | POA: Diagnosis not present

## 2019-06-19 DIAGNOSIS — N400 Enlarged prostate without lower urinary tract symptoms: Secondary | ICD-10-CM | POA: Diagnosis not present

## 2019-06-19 DIAGNOSIS — Z6830 Body mass index (BMI) 30.0-30.9, adult: Secondary | ICD-10-CM | POA: Diagnosis not present

## 2019-06-19 DIAGNOSIS — Z7984 Long term (current) use of oral hypoglycemic drugs: Secondary | ICD-10-CM | POA: Diagnosis not present

## 2019-06-19 DIAGNOSIS — E119 Type 2 diabetes mellitus without complications: Secondary | ICD-10-CM | POA: Diagnosis not present

## 2019-06-19 DIAGNOSIS — M545 Low back pain: Secondary | ICD-10-CM | POA: Diagnosis not present

## 2019-06-19 DIAGNOSIS — E669 Obesity, unspecified: Secondary | ICD-10-CM | POA: Diagnosis not present

## 2019-06-19 DIAGNOSIS — I2510 Atherosclerotic heart disease of native coronary artery without angina pectoris: Secondary | ICD-10-CM | POA: Diagnosis not present

## 2019-06-19 DIAGNOSIS — E785 Hyperlipidemia, unspecified: Secondary | ICD-10-CM | POA: Diagnosis not present

## 2019-07-15 DIAGNOSIS — Z961 Presence of intraocular lens: Secondary | ICD-10-CM | POA: Diagnosis not present

## 2019-07-15 DIAGNOSIS — E113293 Type 2 diabetes mellitus with mild nonproliferative diabetic retinopathy without macular edema, bilateral: Secondary | ICD-10-CM | POA: Diagnosis not present

## 2019-08-10 DIAGNOSIS — I10 Essential (primary) hypertension: Secondary | ICD-10-CM | POA: Diagnosis not present

## 2019-08-10 DIAGNOSIS — J449 Chronic obstructive pulmonary disease, unspecified: Secondary | ICD-10-CM | POA: Diagnosis not present

## 2019-08-10 DIAGNOSIS — M545 Low back pain: Secondary | ICD-10-CM | POA: Diagnosis not present

## 2019-08-10 DIAGNOSIS — I2510 Atherosclerotic heart disease of native coronary artery without angina pectoris: Secondary | ICD-10-CM | POA: Diagnosis not present

## 2019-08-25 DIAGNOSIS — H01009 Unspecified blepharitis unspecified eye, unspecified eyelid: Secondary | ICD-10-CM | POA: Insufficient documentation

## 2019-08-25 DIAGNOSIS — M4712 Other spondylosis with myelopathy, cervical region: Secondary | ICD-10-CM | POA: Insufficient documentation

## 2019-08-25 DIAGNOSIS — J189 Pneumonia, unspecified organism: Secondary | ICD-10-CM | POA: Insufficient documentation

## 2019-08-25 DIAGNOSIS — M545 Low back pain, unspecified: Secondary | ICD-10-CM | POA: Insufficient documentation

## 2019-08-25 DIAGNOSIS — E785 Hyperlipidemia, unspecified: Secondary | ICD-10-CM | POA: Insufficient documentation

## 2019-08-25 DIAGNOSIS — R600 Localized edema: Secondary | ICD-10-CM | POA: Insufficient documentation

## 2019-08-25 DIAGNOSIS — I119 Hypertensive heart disease without heart failure: Secondary | ICD-10-CM | POA: Insufficient documentation

## 2019-08-25 DIAGNOSIS — N138 Other obstructive and reflux uropathy: Secondary | ICD-10-CM | POA: Insufficient documentation

## 2019-08-25 DIAGNOSIS — Z860100 Personal history of colon polyps, unspecified: Secondary | ICD-10-CM | POA: Insufficient documentation

## 2019-08-25 DIAGNOSIS — F17200 Nicotine dependence, unspecified, uncomplicated: Secondary | ICD-10-CM | POA: Insufficient documentation

## 2019-08-25 DIAGNOSIS — K219 Gastro-esophageal reflux disease without esophagitis: Secondary | ICD-10-CM | POA: Insufficient documentation

## 2019-08-25 DIAGNOSIS — I25119 Atherosclerotic heart disease of native coronary artery with unspecified angina pectoris: Secondary | ICD-10-CM | POA: Insufficient documentation

## 2019-08-25 DIAGNOSIS — J449 Chronic obstructive pulmonary disease, unspecified: Secondary | ICD-10-CM | POA: Insufficient documentation

## 2019-08-25 DIAGNOSIS — H26493 Other secondary cataract, bilateral: Secondary | ICD-10-CM

## 2019-08-25 DIAGNOSIS — I739 Peripheral vascular disease, unspecified: Secondary | ICD-10-CM | POA: Insufficient documentation

## 2019-08-25 DIAGNOSIS — E1169 Type 2 diabetes mellitus with other specified complication: Secondary | ICD-10-CM

## 2019-09-01 DIAGNOSIS — M7918 Myalgia, other site: Secondary | ICD-10-CM | POA: Diagnosis not present

## 2019-09-01 DIAGNOSIS — M545 Low back pain: Secondary | ICD-10-CM | POA: Diagnosis not present

## 2019-09-01 DIAGNOSIS — M5417 Radiculopathy, lumbosacral region: Secondary | ICD-10-CM | POA: Diagnosis not present

## 2019-09-01 DIAGNOSIS — M533 Sacrococcygeal disorders, not elsewhere classified: Secondary | ICD-10-CM | POA: Diagnosis not present

## 2019-09-01 DIAGNOSIS — Z79899 Other long term (current) drug therapy: Secondary | ICD-10-CM | POA: Diagnosis not present

## 2019-09-01 DIAGNOSIS — M5416 Radiculopathy, lumbar region: Secondary | ICD-10-CM | POA: Diagnosis not present

## 2019-09-01 DIAGNOSIS — M542 Cervicalgia: Secondary | ICD-10-CM | POA: Diagnosis not present

## 2019-09-01 DIAGNOSIS — Z79891 Long term (current) use of opiate analgesic: Secondary | ICD-10-CM | POA: Diagnosis not present

## 2019-09-29 DIAGNOSIS — M7918 Myalgia, other site: Secondary | ICD-10-CM | POA: Diagnosis not present

## 2019-09-29 DIAGNOSIS — M5416 Radiculopathy, lumbar region: Secondary | ICD-10-CM | POA: Diagnosis not present

## 2019-09-29 DIAGNOSIS — M545 Low back pain: Secondary | ICD-10-CM | POA: Diagnosis not present

## 2019-09-29 DIAGNOSIS — M542 Cervicalgia: Secondary | ICD-10-CM | POA: Diagnosis not present

## 2019-11-10 ENCOUNTER — Other Ambulatory Visit: Payer: Self-pay

## 2019-11-10 ENCOUNTER — Encounter: Payer: Self-pay | Admitting: Family Medicine

## 2019-11-10 ENCOUNTER — Ambulatory Visit (INDEPENDENT_AMBULATORY_CARE_PROVIDER_SITE_OTHER): Payer: Medicare PPO | Admitting: Family Medicine

## 2019-11-10 VITALS — BP 161/71 | HR 63 | Temp 98.1°F | Ht 69.0 in | Wt 220.0 lb

## 2019-11-10 DIAGNOSIS — E785 Hyperlipidemia, unspecified: Secondary | ICD-10-CM | POA: Diagnosis not present

## 2019-11-10 DIAGNOSIS — E1169 Type 2 diabetes mellitus with other specified complication: Secondary | ICD-10-CM

## 2019-11-10 DIAGNOSIS — I25119 Atherosclerotic heart disease of native coronary artery with unspecified angina pectoris: Secondary | ICD-10-CM | POA: Diagnosis not present

## 2019-11-10 DIAGNOSIS — Z125 Encounter for screening for malignant neoplasm of prostate: Secondary | ICD-10-CM

## 2019-11-10 DIAGNOSIS — N429 Disorder of prostate, unspecified: Secondary | ICD-10-CM

## 2019-11-10 DIAGNOSIS — I119 Hypertensive heart disease without heart failure: Secondary | ICD-10-CM

## 2019-11-10 DIAGNOSIS — I10 Essential (primary) hypertension: Secondary | ICD-10-CM

## 2019-11-10 DIAGNOSIS — K219 Gastro-esophageal reflux disease without esophagitis: Secondary | ICD-10-CM

## 2019-11-10 LAB — POCT UA - MICROALBUMIN
Albumin/Creatinine Ratio, Urine, POC: >300
Creatinine, POC: 10 mg/dL
Microalbumin Ur, POC: 30 mg/L

## 2019-11-10 NOTE — Progress Notes (Signed)
New Patient Office Visit  Subjective:  Patient ID: Ricardo Walter, male    DOB: 1951/01/12  Age: 69 y.o. MRN: [id number]  CC: DM/HTN/Hyperlipidemia  HPI Ricardo Walter presents for : DM metformin BID-[facility] 12/20 [facility]-back/neck pain-Norco COPD-no inhalers, tob use-quit "a long time ago" CAD-asa-quad-bypass-Dr. Larose Hires did not see cardiology last year-located in [place]-needs a f/u appt HTN-vasotec 20mg  BID/HCTZ 12.5mg /Metoprolol 25qhs Hyperlipidemia-pravachol 80mg -lft to monitor BPH-flomax Past Medical History:  Diagnosis Date  . Arthritis   . Atherosclerotic heart disease of native coronary artery with unspecified angina pectoris ([place])   . Cervicalgia   . Chronic back pain   . Chronic obstructive pulmonary disease, unspecified ([place])   . Coronary artery disease   . Diabetes mellitus without complication ([place])   . Gastro-esophageal reflux disease without esophagitis   . GERD (gastroesophageal reflux disease)   . Hyperlipidemia, unspecified   . Hypertension   . Hypertensive heart disease without heart failure   . Localized edema   . Low back pain   . Lumbago   . Nicotine dependence, unspecified, uncomplicated   . Other obstructive and reflux uropathy   . Other secondary cataract, bilateral   . Other spondylosis with myelopathy, cervical region   . Peripheral vascular disease, unspecified ([place])   . Personal history of colonic polyps   . Pneumonia, unspecified organism   . Type 2 diabetes mellitus with other specified complication ([place])   . Unspecified blepharitis unspecified eye, unspecified eyelid     Past Surgical History:  Procedure Laterality Date  . BACK SURGERY     X 3  . CERVICAL SPINE SURGERY     X 6  . COLONOSCOPY N/A 03/03/2015   Procedure: COLONOSCOPY;  Surgeon: Danie Binder, MD;  [facility];  Service: Endoscopy;  Laterality: N/A;  12:15 PM  . CORONARY ARTERY BYPASS GRAFT    . EYE SURGERY    . SPINE SURGERY       Family History  Problem Relation Age of Onset  . Cancer Mother   . Diabetes Father   . Cancer Sister   . Cancer Brother     Social History   Socioeconomic History  . Marital status: Married    Spouse name: Not on file  . Number of children: Not on file  . Years of education: Not on file  . Highest education level: Not on file  Occupational History  . Occupation: retired  Tobacco Use  . Smoking status: Former Smoker    Packs/day: 1.50    Years: 30.00    Pack years: 45.00    Types: Cigarettes    Quit date: 06/16/2014    Years since quitting: 5.4  . Smokeless tobacco: Never Used  . Tobacco comment: Encouraged to remain smoke free  Substance and Sexual Activity  . Alcohol use: No    Alcohol/week: 0.0 standard drinks  . Drug use: No  . Sexual activity: Not on file  Other Topics Concern  . Not on file  Social History Narrative  . Not on file   Social Determinants of Health   Financial Resource Strain:   . Difficulty of Paying Living Expenses: Not on file  Food Insecurity:   . Worried About Charity fundraiser in the Last Year: Not on file  . Ran Out of Food in the Last Year: Not on file  Transportation Needs:   . Lack of Transportation (Medical): Not on file  . Lack of Transportation (Non-Medical): Not  on file  Physical Activity:   . Days of Exercise per Week: Not on file  . Minutes of Exercise per Session: Not on file  Stress:   . Feeling of Stress : Not on file  Social Connections:   . Frequency of Communication with Friends and Family: Not on file  . Frequency of Social Gatherings with Friends and Family: Not on file  . Attends Religious Services: Not on file  . Active Member of Clubs or Organizations: Not on file  . Attends Archivist Meetings: Not on file  . Marital Status: Not on file  Intimate Partner Violence:   . Fear of Current or Ex-Partner: Not on file  . Emotionally Abused: Not on file  . Physically Abused: Not on file  . Sexually  Abused: Not on file    ROS Review of Systems  Constitutional: Negative.   HENT: Negative.   Eyes: Negative.   Respiratory:       Copd  Cardiovascular: Negative.   Gastrointestinal:       GERD  Endocrine:       DM  Genitourinary: Negative.   Musculoskeletal: Negative.   Skin:       Nail thickening and yellow  Allergic/Immunologic: Negative.   Neurological: Negative.   Hematological: Negative.   Psychiatric/Behavioral: Negative.     Objective:   Today's Vitals: BP (!) 161/71 (BP Location: Left Arm, Patient Position: Sitting)   Pulse 63   Temp 98.1 F (36.7 C) (Temporal)   Ht 5\' 9"  (1.753 m)   Wt 220 lb (99.8 kg)   SpO2 96%   BMI 32.49 kg/m   Physical Exam Constitutional:      Appearance: Normal appearance.  HENT:     Head: Normocephalic and atraumatic.  Cardiovascular:     Rate and Rhythm: Normal rate and regular rhythm.     Pulses: Normal pulses.     Heart sounds: Normal heart sounds.  Pulmonary:     Effort: Pulmonary effort is normal.     Breath sounds: Normal breath sounds.  Musculoskeletal:     Cervical back: Normal range of motion and neck supple.  Skin:    Comments: Nails thick, yellow, cracking  Neurological:     Mental Status: He is alert and oriented to person, place, and time.  Psychiatric:        Mood and Affect: Mood normal.        Behavior: Behavior normal.     Assessment & Plan:  1. Atherosclerosis of native coronary artery with angina pectoris, unspecified whether native or transplanted heart [facility]) Metoprolol/enalapril/HCTZ/amlodipine/-sees cardiology-continue asa Asa daily 2. Type 2 diabetes mellitus with other specified complication, unspecified whether long term insulin use (HCC) Metformin BID - COMPLETE METABOLIC PANEL WITH GFR - Hemoglobin A1c Foot exam -thick, yellow nails  Eye exam normal per pt 3. Gastro-esophageal reflux disease without esophagitis No medications - CBC w/Diff/Platelet  4. Hyperlipidemia, unspecified  hyperlipidemia type Pravastatin-lft - Lipid panel  5. Hypertensive heart disease without heart failure Metoprolol/enalapril/HCTZ/amlodipine-sees cardiology - TSH  6. Essential (primary) hypertension  - TSH  7. Screening for prostate cancer PSA  8. Disorder of prostate, unspecified  BPH-flomax - PSA  Outpatient Encounter Medications as of 11/10/2019  Medication Sig  . amLODipine (NORVASC) 10 MG tablet Take 10 mg by mouth daily.  Marland Kitchen aspirin 81 MG tablet Take 81 mg by mouth daily.  . enalapril (VASOTEC) 20 MG tablet Take 20 mg by mouth daily.  . hydrochlorothiazide (HYDRODIURIL) 12.5 MG tablet  Take 12.5 mg by mouth daily.  Marland Kitchen HYDROcodone-acetaminophen (NORCO) 10-325 MG per tablet Take 1 tablet by mouth every 6 (six) hours as needed (pain).   . metFORMIN (GLUCOPHAGE) 500 MG tablet Take 1,000 mg by mouth 2 (two) times daily with a meal.   . metoprolol succinate (TOPROL-XL) 25 MG 24 hr tablet Take 25 mg by mouth daily.  . pravastatin (PRAVACHOL) 80 MG tablet Take 80 mg by mouth daily.  . tamsulosin (FLOMAX) 0.4 MG CAPS capsule Take 0.4 mg by mouth.  . [DISCONTINUED] aspirin EC 81 MG tablet Take 81 mg by mouth daily.  . [DISCONTINUED] cyclobenzaprine (FLEXERIL) 10 MG tablet Take 10 mg by mouth 2 (two) times daily.  . [DISCONTINUED] polyethylene glycol-electrolytes (TRILYTE) 420 G solution Take 4,000 mLs by mouth as directed.  . [DISCONTINUED] Sod Picosulfate-Mag Ox-Cit Acd 10-3.5-12 MG-GM-GM PACK Take 1 Container by mouth as directed.   No facility-administered encounter medications on file as of 11/10/2019.  20min review of history, old records, exam including foot exam, assessment and review of plan  Follow-up:75months   Hannah Beat, MD

## 2019-11-11 LAB — CBC WITH DIFFERENTIAL/PLATELET
Absolute Monocytes: 559 cells/uL (ref 200–950)
Basophils Absolute: 90 cells/uL (ref 0–200)
Basophils Relative: 1.3 %
Eosinophils Absolute: 138 cells/uL (ref 15–500)
Eosinophils Relative: 2 %
HCT: 35.2 % — ABNORMAL LOW (ref 38.5–50.0)
Hemoglobin: 12.1 g/dL — ABNORMAL LOW (ref 13.2–17.1)
Lymphs Abs: 1835 cells/uL (ref 850–3900)
MCH: 30.8 pg (ref 27.0–33.0)
MCHC: 34.4 g/dL (ref 32.0–36.0)
MCV: 89.6 fL (ref 80.0–100.0)
MPV: 9.2 fL (ref 7.5–12.5)
Monocytes Relative: 8.1 %
Neutro Abs: 4278 cells/uL (ref 1500–7800)
Neutrophils Relative %: 62 %
Platelets: 324 10*3/uL (ref 140–400)
RBC: 3.93 10*6/uL — ABNORMAL LOW (ref 4.20–5.80)
RDW: 13 % (ref 11.0–15.0)
Total Lymphocyte: 26.6 %
WBC: 6.9 10*3/uL (ref 3.8–10.8)

## 2019-11-11 LAB — LIPID PANEL
Cholesterol: 200 mg/dL — ABNORMAL HIGH (ref <200)
HDL: 30 mg/dL — ABNORMAL LOW (ref >=40)
LDL Cholesterol (Calc): 117 mg/dL (calc) — ABNORMAL HIGH
Non-HDL Cholesterol (Calc): 170 mg/dL (calc) — ABNORMAL HIGH (ref <130)
Total CHOL/HDL Ratio: 6.7 (calc) — ABNORMAL HIGH (ref <5.0)
Triglycerides: 374 mg/dL — ABNORMAL HIGH (ref <150)

## 2019-11-11 LAB — COMPLETE METABOLIC PANEL WITH GFR
AG Ratio: 1.8 (calc) (ref 1.0–2.5)
ALT: 32 U/L (ref 9–46)
AST: 28 U/L (ref 10–35)
Albumin: 4.4 g/dL (ref 3.6–5.1)
Alkaline phosphatase (APISO): 57 U/L (ref 35–144)
BUN/Creatinine Ratio: 12 (calc) (ref 6–22)
BUN: 19 mg/dL (ref 7–25)
CO2: 24 mmol/L (ref 20–32)
Calcium: 9.2 mg/dL (ref 8.6–10.3)
Chloride: 102 mmol/L (ref 98–110)
Creat: 1.6 mg/dL — ABNORMAL HIGH (ref 0.70–1.25)
GFR, Est African American: 51 mL/min/{1.73_m2} — ABNORMAL LOW (ref >=60)
GFR, Est Non African American: 44 mL/min/{1.73_m2} — ABNORMAL LOW (ref >=60)
Globulin: 2.4 g/dL (calc) (ref 1.9–3.7)
Glucose, Bld: 125 mg/dL — ABNORMAL HIGH (ref 65–99)
Potassium: 4.7 mmol/L (ref 3.5–5.3)
Sodium: 136 mmol/L (ref 135–146)
Total Bilirubin: 0.4 mg/dL (ref 0.2–1.2)
Total Protein: 6.8 g/dL (ref 6.1–8.1)

## 2019-11-11 LAB — HEMOGLOBIN A1C
Hgb A1c MFr Bld: 7.8 % of total Hgb — ABNORMAL HIGH (ref <5.7)
Mean Plasma Glucose: 177 (calc)
eAG (mmol/L): 9.8 (calc)

## 2019-11-11 LAB — TSH: TSH: 1.92 mIU/L (ref 0.40–4.50)

## 2019-11-11 LAB — PSA: PSA: 1 ng/mL (ref <=4.0)

## 2019-11-18 ENCOUNTER — Other Ambulatory Visit: Payer: Self-pay

## 2019-11-18 ENCOUNTER — Ambulatory Visit (INDEPENDENT_AMBULATORY_CARE_PROVIDER_SITE_OTHER): Payer: Medicare PPO | Admitting: Family Medicine

## 2019-11-18 ENCOUNTER — Encounter: Payer: Self-pay | Admitting: Family Medicine

## 2019-11-18 VITALS — BP 148/73 | HR 61 | Temp 98.1°F | Wt 220.0 lb

## 2019-11-18 DIAGNOSIS — I119 Hypertensive heart disease without heart failure: Secondary | ICD-10-CM

## 2019-11-18 DIAGNOSIS — E1169 Type 2 diabetes mellitus with other specified complication: Secondary | ICD-10-CM

## 2019-11-18 DIAGNOSIS — E785 Hyperlipidemia, unspecified: Secondary | ICD-10-CM | POA: Diagnosis not present

## 2019-11-18 NOTE — Progress Notes (Signed)
Established Patient Office Visit  Subjective:  Patient ID: Ricardo Walter, male    DOB: [birth year]  Age: 69 y.o. MRN: [id number]  CC:  Chief Complaint  Patient presents with  . Diabetes    f/u lab work    HPI [employer] presents for elevated glucose and A1c-taking metformin BID TC-200, Trig 374, LDL 117, Cr 1.60 TSH and PSA normal range Past Medical History:  Diagnosis Date  . Arthritis   . Atherosclerotic heart disease of native coronary artery with unspecified angina pectoris ([place])   . Cervicalgia   . Chronic back pain   . Chronic obstructive pulmonary disease, unspecified ([place])   . Coronary artery disease   . Diabetes mellitus without complication ([place])   . Gastro-esophageal reflux disease without esophagitis   . GERD (gastroesophageal reflux disease)   . Hyperlipidemia, unspecified   . Hypertension   . Hypertensive heart disease without heart failure   . Localized edema   . Low back pain   . Lumbago   . Nicotine dependence, unspecified, uncomplicated   . Other obstructive and reflux uropathy   . Other secondary cataract, bilateral   . Other spondylosis with myelopathy, cervical region   . Peripheral vascular disease, unspecified ([place])   . Personal history of colonic polyps   . Pneumonia, unspecified organism   . Type 2 diabetes mellitus with other specified complication ([place])   . Unspecified blepharitis unspecified eye, unspecified eyelid     Past Surgical History:  Procedure Laterality Date  . BACK SURGERY     X 3  . CERVICAL SPINE SURGERY     X 6  . COLONOSCOPY N/A 03/03/2015   Procedure: COLONOSCOPY;  Surgeon: Danie Binder, MD;  [facility];  Service: Endoscopy;  Laterality: N/A;  12:15 PM  . CORONARY ARTERY BYPASS GRAFT    . EYE SURGERY    . SPINE SURGERY      Family History  Problem Relation Age of Onset  . Cancer Mother   . Diabetes Father   . Cancer Sister   . Cancer Brother     Social History   Socioeconomic History   . Marital status: Married    Spouse name: Not on file  . Number of children: Not on file  . Years of education: Not on file  . Highest education level: Not on file  Occupational History  . Occupation: retired  Tobacco Use  . Smoking status: Former Smoker    Packs/day: 1.50    Years: 30.00    Pack years: 45.00    Types: Cigarettes    Quit date: 06/16/2014    Years since quitting: 5.4  . Smokeless tobacco: Never Used  . Tobacco comment: Encouraged to remain smoke free  Substance and Sexual Activity  . Alcohol use: No    Alcohol/week: 0.0 standard drinks  . Drug use: No  . Sexual activity: Not on file  Other Topics Concern  . Not on file  Social History Narrative  . Not on file   Social Determinants of Health   Financial Resource Strain:   . Difficulty of Paying Living Expenses: Not on file  Food Insecurity:   . Worried About Charity fundraiser in the Last Year: Not on file  . Ran Out of Food in the Last Year: Not on file  Transportation Needs:   . Lack of Transportation (Medical): Not on file  . Lack of Transportation (Non-Medical): Not on file  Physical Activity:   .  Days of Exercise per Week: Not on file  . Minutes of Exercise per Session: Not on file  Stress:   . Feeling of Stress : Not on file  Social Connections:   . Frequency of Communication with Friends and Family: Not on file  . Frequency of Social Gatherings with Friends and Family: Not on file  . Attends Religious Services: Not on file  . Active Member of Clubs or Organizations: Not on file  . Attends Archivist Meetings: Not on file  . Marital Status: Not on file  Intimate Partner Violence:   . Fear of Current or Ex-Partner: Not on file  . Emotionally Abused: Not on file  . Physically Abused: Not on file  . Sexually Abused: Not on file    Outpatient Medications Prior to Visit  Medication Sig Dispense Refill  . ACCU-CHEK AVIVA PLUS test strip     . Accu-Chek Softclix Lancets lancets      . Alcohol Swabs (B-D SINGLE USE SWABS REGULAR) PADS     . amLODipine (NORVASC) 10 MG tablet Take 10 mg by mouth daily.    Marland Kitchen aspirin 81 MG tablet Take 81 mg by mouth daily.    . enalapril (VASOTEC) 20 MG tablet Take 20 mg by mouth daily.    . hydrochlorothiazide (HYDRODIURIL) 12.5 MG tablet Take 12.5 mg by mouth daily.    Marland Kitchen HYDROcodone-acetaminophen (NORCO) 10-325 MG per tablet Take 1 tablet by mouth every 6 (six) hours as needed (pain).     . metFORMIN (GLUCOPHAGE) 1000 MG tablet     . metoprolol succinate (TOPROL-XL) 25 MG 24 hr tablet Take 25 mg by mouth daily.    . pravastatin (PRAVACHOL) 80 MG tablet Take 80 mg by mouth daily.    . tamsulosin (FLOMAX) 0.4 MG CAPS capsule Take 0.4 mg by mouth.    . metFORMIN (GLUCOPHAGE) 500 MG tablet Take 1,000 mg by mouth 2 (two) times daily with a meal.      No facility-administered medications prior to visit.    Allergies  Allergen Reactions  . Nabumetone Other (See Comments)    Elevated LFT's  . Naproxen Other (See Comments)    Increased LFT'S  . Oxycodone-Acetaminophen Itching    ROS Review of Systems  Eyes: Negative.   Respiratory: Negative.   Cardiovascular: Negative.   Genitourinary: Positive for frequency.      Objective:    Physical Exam DISCUSSION LABWORK_COUNSELING GREATER THAN 50% BP (!) 148/73 (BP Location: Left Arm, Patient Position: Sitting)   Pulse 61   Temp 98.1 F (36.7 C) (Temporal)   Wt 220 lb (99.8 kg)   SpO2 95%   BMI 32.49 kg/m  Wt Readings from Last 3 Encounters:  11/18/19 220 lb (99.8 kg)  11/10/19 220 lb (99.8 kg)  03/03/15 214 lb (97.1 kg)     Lab Results  Component Value Date   TSH 1.92 11/10/2019   Lab Results  Component Value Date   WBC 6.9 11/10/2019   HGB 12.1 (L) 11/10/2019   HCT 35.2 (L) 11/10/2019   MCV 89.6 11/10/2019   PLT 324 11/10/2019   Lab Results  Component Value Date   NA 136 11/10/2019   K 4.7 11/10/2019   CO2 24 11/10/2019   GLUCOSE 125 (H) 11/10/2019   BUN 19  11/10/2019   CREATININE 1.60 (H) 11/10/2019   BILITOT 0.4 11/10/2019   ALKPHOS 51 11/12/2018   AST 28 11/10/2019   ALT 32 11/10/2019   PROT 6.8 11/10/2019  ALBUMIN 4.2 11/12/2018   CALCIUM 9.2 11/10/2019   Lab Results  Component Value Date   CHOL 200 (H) 11/10/2019   Lab Results  Component Value Date   HDL 30 (L) 11/10/2019   Lab Results  Component Value Date   LDLCALC 117 (H) 11/10/2019   Lab Results  Component Value Date   TRIG 374 (H) 11/10/2019   Lab Results  Component Value Date   CHOLHDL 6.7 (H) 11/10/2019   Lab Results  Component Value Date   HGBA1C 7.8 (H) 11/10/2019      Assessment & Plan:  1. Hyperlipidemia, unspecified hyperlipidemia type Pravachol 80mg  daily-goal of LDL 70-recheck 3 months 2. Type 2 diabetes mellitus with other specified complication, unspecified whether long term insulin use (HCC) Increase metformin to 1000mg  BID-check glucose fasting and with largest meal Goal 100 fasting-recheck 3 months 3. Hypertensive heart disease without heart failure Vasotec/amlodipine/metoprolol-d/w pt control 130/80 bp readings at home  Counseling appt on labwork-37minutes time Renarda Mullinix Hannah Beat, MD

## 2019-11-18 NOTE — Patient Instructions (Addendum)
COVID-19 Vaccine Information can be found at: [email or web address] For questions related to vaccine distribution or appointments, please email [email or web address] or call [phone].    Elevated glucose on fasting labwork Goal of 100 fasting Increase exercise -recommend 179min/week of moderate exercise Concern for elevated cholesterol, elevated glucose Check blood pressure daily-goal is 130/80

## 2019-11-29 ENCOUNTER — Other Ambulatory Visit: Payer: Self-pay | Admitting: Emergency Medicine

## 2019-11-29 DIAGNOSIS — E1169 Type 2 diabetes mellitus with other specified complication: Secondary | ICD-10-CM

## 2019-11-29 MED ORDER — ACCU-CHEK SOFTCLIX LANCETS MISC: 12 refills | Status: AC

## 2019-11-29 MED ORDER — ACCU-CHEK AVIVA PLUS VI STRP: ORAL_STRIP | 12 refills | Status: AC

## 2019-11-29 MED ORDER — BD SWAB SINGLE USE REGULAR PADS: MEDICATED_PAD | 12 refills | Status: AC

## 2020-03-24 DIAGNOSIS — M545 Low back pain: Secondary | ICD-10-CM | POA: Diagnosis not present

## 2020-03-24 DIAGNOSIS — M5416 Radiculopathy, lumbar region: Secondary | ICD-10-CM | POA: Diagnosis not present

## 2020-03-24 DIAGNOSIS — M5417 Radiculopathy, lumbosacral region: Secondary | ICD-10-CM | POA: Diagnosis not present

## 2020-03-24 DIAGNOSIS — M542 Cervicalgia: Secondary | ICD-10-CM | POA: Diagnosis not present

## 2020-03-29 DIAGNOSIS — E785 Hyperlipidemia, unspecified: Secondary | ICD-10-CM | POA: Diagnosis not present

## 2020-03-29 DIAGNOSIS — N1832 Chronic kidney disease, stage 3b: Secondary | ICD-10-CM | POA: Diagnosis not present

## 2020-03-29 DIAGNOSIS — I2510 Atherosclerotic heart disease of native coronary artery without angina pectoris: Secondary | ICD-10-CM | POA: Diagnosis not present

## 2020-03-29 DIAGNOSIS — E119 Type 2 diabetes mellitus without complications: Secondary | ICD-10-CM | POA: Diagnosis not present

## 2020-04-21 DIAGNOSIS — Z79899 Other long term (current) drug therapy: Secondary | ICD-10-CM | POA: Diagnosis not present

## 2020-04-21 DIAGNOSIS — M5417 Radiculopathy, lumbosacral region: Secondary | ICD-10-CM | POA: Diagnosis not present

## 2020-04-21 DIAGNOSIS — M533 Sacrococcygeal disorders, not elsewhere classified: Secondary | ICD-10-CM | POA: Diagnosis not present

## 2020-04-21 DIAGNOSIS — M5416 Radiculopathy, lumbar region: Secondary | ICD-10-CM | POA: Diagnosis not present

## 2020-04-21 DIAGNOSIS — M7918 Myalgia, other site: Secondary | ICD-10-CM | POA: Diagnosis not present

## 2020-04-21 DIAGNOSIS — M545 Low back pain: Secondary | ICD-10-CM | POA: Diagnosis not present

## 2020-04-21 DIAGNOSIS — M542 Cervicalgia: Secondary | ICD-10-CM | POA: Diagnosis not present

## 2020-04-24 DIAGNOSIS — M2010 Hallux valgus (acquired), unspecified foot: Secondary | ICD-10-CM | POA: Diagnosis not present

## 2020-04-24 DIAGNOSIS — R7989 Other specified abnormal findings of blood chemistry: Secondary | ICD-10-CM | POA: Diagnosis not present

## 2020-04-24 DIAGNOSIS — M2040 Other hammer toe(s) (acquired), unspecified foot: Secondary | ICD-10-CM | POA: Diagnosis not present

## 2020-04-24 DIAGNOSIS — N189 Chronic kidney disease, unspecified: Secondary | ICD-10-CM | POA: Diagnosis not present

## 2020-04-24 DIAGNOSIS — E559 Vitamin D deficiency, unspecified: Secondary | ICD-10-CM | POA: Diagnosis not present

## 2020-04-24 DIAGNOSIS — E119 Type 2 diabetes mellitus without complications: Secondary | ICD-10-CM | POA: Diagnosis not present

## 2020-04-24 DIAGNOSIS — B351 Tinea unguium: Secondary | ICD-10-CM | POA: Diagnosis not present

## 2020-04-24 DIAGNOSIS — B353 Tinea pedis: Secondary | ICD-10-CM | POA: Diagnosis not present

## 2020-05-24 DIAGNOSIS — M545 Low back pain: Secondary | ICD-10-CM | POA: Diagnosis not present

## 2020-05-24 DIAGNOSIS — M5417 Radiculopathy, lumbosacral region: Secondary | ICD-10-CM | POA: Diagnosis not present

## 2020-05-24 DIAGNOSIS — M5416 Radiculopathy, lumbar region: Secondary | ICD-10-CM | POA: Diagnosis not present

## 2020-05-24 DIAGNOSIS — M542 Cervicalgia: Secondary | ICD-10-CM | POA: Diagnosis not present

## 2020-05-24 DIAGNOSIS — M7918 Myalgia, other site: Secondary | ICD-10-CM | POA: Diagnosis not present

## 2020-06-01 DIAGNOSIS — E785 Hyperlipidemia, unspecified: Secondary | ICD-10-CM | POA: Diagnosis not present

## 2020-06-01 DIAGNOSIS — N1832 Chronic kidney disease, stage 3b: Secondary | ICD-10-CM | POA: Diagnosis not present

## 2020-06-01 DIAGNOSIS — I2510 Atherosclerotic heart disease of native coronary artery without angina pectoris: Secondary | ICD-10-CM | POA: Diagnosis not present

## 2020-06-01 DIAGNOSIS — E1159 Type 2 diabetes mellitus with other circulatory complications: Secondary | ICD-10-CM | POA: Diagnosis not present

## 2020-06-01 DIAGNOSIS — Z125 Encounter for screening for malignant neoplasm of prostate: Secondary | ICD-10-CM | POA: Diagnosis not present

## 2020-06-01 DIAGNOSIS — I10 Essential (primary) hypertension: Secondary | ICD-10-CM | POA: Diagnosis not present

## 2020-06-01 DIAGNOSIS — Z23 Encounter for immunization: Secondary | ICD-10-CM | POA: Diagnosis not present

## 2020-06-16 DIAGNOSIS — N1832 Chronic kidney disease, stage 3b: Secondary | ICD-10-CM | POA: Diagnosis not present

## 2020-10-11 ENCOUNTER — Other Ambulatory Visit: Payer: Self-pay | Admitting: Family Medicine

## 2020-10-11 DIAGNOSIS — E1169 Type 2 diabetes mellitus with other specified complication: Secondary | ICD-10-CM
